# Patient Record
Sex: Female | Born: 1970 | Race: White | Hispanic: No | Smoking: Never smoker
Health system: Southern US, Community
[De-identification: ages and names within clinical notes are randomized; demographics above are authoritative.]

## PROBLEM LIST (undated history)

## (undated) DIAGNOSIS — I10 Essential (primary) hypertension: Secondary | ICD-10-CM

## (undated) DIAGNOSIS — E119 Type 2 diabetes mellitus without complications: Secondary | ICD-10-CM

## (undated) DIAGNOSIS — T7840XA Allergy, unspecified, initial encounter: Secondary | ICD-10-CM

## (undated) HISTORY — DX: Type 2 diabetes mellitus without complications: E11.9

## (undated) HISTORY — DX: Essential (primary) hypertension: I10

## (undated) HISTORY — DX: Allergy, unspecified, initial encounter: T78.40XA

## (undated) HISTORY — PX: ECTOPIC PREGNANCY SURGERY: SHX613

---

## 2006-03-05 ENCOUNTER — Ambulatory Visit: Payer: Self-pay | Admitting: Obstetrics and Gynecology

## 2006-03-20 ENCOUNTER — Ambulatory Visit: Payer: Self-pay | Admitting: Obstetrics and Gynecology

## 2006-04-25 ENCOUNTER — Ambulatory Visit: Payer: Self-pay | Admitting: Obstetrics and Gynecology

## 2007-05-12 ENCOUNTER — Ambulatory Visit: Payer: Self-pay | Admitting: Internal Medicine

## 2007-05-22 ENCOUNTER — Ambulatory Visit: Payer: Self-pay | Admitting: Emergency Medicine

## 2009-08-12 ENCOUNTER — Ambulatory Visit: Payer: Self-pay | Admitting: Internal Medicine

## 2010-03-09 ENCOUNTER — Ambulatory Visit: Payer: Self-pay | Admitting: Family Medicine

## 2010-03-10 ENCOUNTER — Ambulatory Visit: Payer: Self-pay | Admitting: Internal Medicine

## 2010-03-12 ENCOUNTER — Emergency Department: Payer: Self-pay | Admitting: Internal Medicine

## 2010-03-12 ENCOUNTER — Ambulatory Visit: Payer: Self-pay | Admitting: Internal Medicine

## 2010-03-18 ENCOUNTER — Ambulatory Visit: Payer: Self-pay | Admitting: Internal Medicine

## 2016-11-16 ENCOUNTER — Encounter: Payer: Self-pay | Admitting: *Deleted

## 2016-11-16 ENCOUNTER — Encounter: Payer: BLUE CROSS/BLUE SHIELD | Attending: Family Medicine | Admitting: *Deleted

## 2016-11-16 VITALS — BP 114/86 | Ht 71.0 in | Wt 213.8 lb

## 2016-11-16 DIAGNOSIS — Z713 Dietary counseling and surveillance: Secondary | ICD-10-CM | POA: Diagnosis present

## 2016-11-16 DIAGNOSIS — E119 Type 2 diabetes mellitus without complications: Secondary | ICD-10-CM | POA: Diagnosis not present

## 2016-11-16 NOTE — Progress Notes (Signed)
Diabetes Self-Management Education  Visit Type: First/Initial  Appt. Start Time: 1405 Appt. End Time: 1500  11/16/2016  [name], identified by name and date of birth, is a 46 y.o. female with a diagnosis of Diabetes: Type 2.   ASSESSMENT  Blood pressure 114/86, height 5\' 11"  (1.803 m), weight 213 lb 12.8 oz (97 kg). Body mass index is 29.82 kg/m.      Diabetes Self-Management Education - 11/16/16 1539      Visit Information   Visit Type First/Initial     Initial Visit   Diabetes Type Type 2   Are you currently following a meal plan? Yes   What type of meal plan do you follow? "low carbs and cutting out sugar intake"   Are you taking your medications as prescribed? Yes   Date Diagnosed this month     Health Coping   How would you rate your overall health? Good     Psychosocial Assessment   Patient Belief/Attitude about Diabetes Motivated to manage diabetes  "not being able to eat the foods I like to eat"   Self-care barriers None   Self-management support Doctor's office;Family   Patient Concerns Nutrition/Meal planning;Medication;Healthy Lifestyle;Monitoring;Problem Solving;Glycemic Control;Weight Control   Special Needs None   Preferred Learning Style Hands on   Learning Readiness Change in progress   How often do you need to have someone help you when you read instructions, pamphlets, or other written materials from your doctor or pharmacy? 1 - Never   What is the last grade level you completed in school? HS - taking online courses     Pre-Education Assessment   Patient understands the diabetes disease and treatment process. Needs Instruction   Patient understands incorporating nutritional management into lifestyle. Needs Instruction   Patient undertands incorporating physical activity into lifestyle. Needs Instruction   Patient understands using medications safely. Needs Instruction   Patient understands monitoring blood glucose, interpreting and using  results Needs Review   Patient understands prevention, detection, and treatment of acute complications. Needs Instruction   Patient understands prevention, detection, and treatment of chronic complications. Needs Instruction   Patient understands how to develop strategies to address psychosocial issues. Needs Instruction   Patient understands how to develop strategies to promote health/change behavior. Needs Instruction     Complications   Last HgB A1C per patient/outside source 10.7 %  10/25/16   How often do you check your blood sugar? 1-2 times/day   Fasting Blood glucose range (mg/dL) 16-109;604-54070-129;130-179  Pt reports FBG's range from 97-176 mg/dL.    Postprandial Blood glucose range (mg/dL) 981-191130-179  Pt reports pp's less than 160's mg/dL.    Have you had a dilated eye exam in the past 12 months? Yes   Have you had a dental exam in the past 12 months? No   Are you checking your feet? No     Dietary Intake   Breakfast cereal and milk; egg whites with [place] bacon or sausage and bread   Snack (morning) grapes   Lunch fried chicken tenders, fried okra, salads   Snack (afternoon) grapes   Dinner oven baked pork chops, beans, cauliflower; steak with baked potato; pizza; spaghetti   Snack (evening) sherbert   Beverage(s) water, unsweetened tea     Exercise   Exercise Type ADL's     Patient Education   Previous Diabetes Education No   Disease state  Definition of diabetes, type 1 and 2, and the diagnosis of diabetes   Nutrition management  Role of diet in the treatment of diabetes and the relationship between the three main macronutrients and blood glucose level;Reviewed blood glucose goals for pre and post meals and how to evaluate the patients' food intake on their blood glucose level.   Physical activity and exercise  Role of exercise on diabetes management, blood pressure control and cardiac health.   Medications Reviewed patients medication for diabetes, action, purpose, timing of dose  and side effects.   Monitoring Purpose and frequency of SMBG.;Taught/discussed recording of test results and interpretation of SMBG.;Identified appropriate SMBG and/or A1C goals.   Chronic complications Relationship between chronic complications and blood glucose control   Psychosocial adjustment Identified and addressed patients feelings and concerns about diabetes     Individualized Goals (developed by patient)   Reducing Risk Improve blood sugars Decrease medications Prevent diabetes complications Lose weight Lead a healthier lifestyle     Outcomes   Expected Outcomes Demonstrated interest in learning. Expect positive outcomes      Individualized Plan for Diabetes Self-Management Training:   Learning Objective:  Patient will have a greater understanding of diabetes self-management. Patient education plan is to attend individual and/or group sessions per assessed needs and concerns.   Plan:   Patient Instructions  Check blood sugars 2 x day before breakfast and 2 hrs after one meal every day Bring blood sugar records to the next class Exercise: Begin walking for 10-15  minutes 3 days a week and gradually increase to 30 minutes 5 x week Eat 3 meals day,   2  snacks a day Space meals 4-6 hours apart Allow 2-3 hours between meals and snacks Limit fried foods   Expected Outcomes:  Demonstrated interest in learning. Expect positive outcomes  Education material provided:  General Meal Planning Guidelines Simple Meal Plan  If problems or questions, patient to contact team via:  Sharion Settler, RN, CCM, CDE [phone]  Future DSME appointment:  December 07, 2016 for Diabetes Class 1

## 2016-11-16 NOTE — Patient Instructions (Signed)
Check blood sugars 2 x day before breakfast and 2 hrs after one meal every day  Bring blood sugar records to the next class  Exercise: Begin walking for 10-15  minutes 3 days a week and gradually increase to 30 minutes 5 x week  Eat 3 meals day,   2  snacks a day Space meals 4-6 hours apart Allow 2-3 hours between meals and snacks Limit fried foods  Return for classes on:

## 2016-12-07 ENCOUNTER — Encounter: Payer: BLUE CROSS/BLUE SHIELD | Attending: Family Medicine | Admitting: Dietician

## 2016-12-07 ENCOUNTER — Encounter: Payer: Self-pay | Admitting: Dietician

## 2016-12-07 VITALS — Ht 71.0 in | Wt 216.0 lb

## 2016-12-07 DIAGNOSIS — E119 Type 2 diabetes mellitus without complications: Secondary | ICD-10-CM | POA: Diagnosis not present

## 2016-12-07 DIAGNOSIS — Z713 Dietary counseling and surveillance: Secondary | ICD-10-CM | POA: Diagnosis not present

## 2016-12-07 NOTE — Progress Notes (Signed)

## 2017-01-11 ENCOUNTER — Encounter: Payer: Self-pay | Admitting: *Deleted

## 2017-01-11 ENCOUNTER — Encounter: Payer: BLUE CROSS/BLUE SHIELD | Attending: Family Medicine | Admitting: *Deleted

## 2017-01-11 VITALS — Wt 218.6 lb

## 2017-01-11 DIAGNOSIS — E119 Type 2 diabetes mellitus without complications: Secondary | ICD-10-CM | POA: Insufficient documentation

## 2017-01-11 DIAGNOSIS — Z713 Dietary counseling and surveillance: Secondary | ICD-10-CM | POA: Insufficient documentation

## 2017-01-11 NOTE — Progress Notes (Signed)
Appt. Start Time: 0900 Appt. End Time: 1130  Class 2 Nutritional Management - identify sources of carbohydrate, protein and fat; plan balanced meals; estimate servings of carbohydrates in meals  Psychosocial - identify DM as a source of stress; state the effects of stress on BG control  Exercise - describe the effects of exercise on blood glucose and importance of regular exercise in controlling diabetes; state a plan for personal exercise; verbalize contraindications for exercise  Self-Monitoring - state importance of SMBG; use SMBG results to effectively manage diabetes; identify importance of regular HbA1C testing and goals for results  Acute Complications - recognize hyperglycemia and hypoglycemia with causes and effects; identify blood glucose results as high, low or in control; list steps in treating and preventing high and low blood glucose  Sick Day Guidelines: state appropriate measure to manage blood glucose when ill (need for meds, HBGM plan, when to call physician, need for fluids)  Chronic Complications/Foot, Skin, Eye Dental Care - identify possible long-term complications of diabetes (retinopathy, neuropathy, nephropathy, cardiovascular disease, infections); explain steps in prevention and treatment of chronic complications; state importance of daily self-foot exams; describe how to examine feet and what to look for; explain appropriate eye and dental care  Lifestyle Changes/Goals - state benefits of making appropriate lifestyle changes; identify habits that need to change (meals, tobacco, alcohol); identify strategies to reduce risk factors for personal health  Pregnancy/Sexual Health - state importance of good blood glucose control in preventing sexual problems (impotence, vaginal dryness, infections, loss of desire)  Teaching Materials Used: Class 2 Slide Packet A1C Pamphlet Foot Care Literature Kidney Test Handout Quick and "Balanced" Meal Ideas Carb Counting and Meal  Planning Book Goals for Class 2 

## 2017-02-15 ENCOUNTER — Ambulatory Visit: Payer: BLUE CROSS/BLUE SHIELD

## 2017-03-15 ENCOUNTER — Ambulatory Visit: Payer: Self-pay

## 2017-04-13 ENCOUNTER — Encounter: Payer: Self-pay | Admitting: *Deleted

## 2017-06-07 ENCOUNTER — Emergency Department: Payer: Self-pay

## 2017-06-07 ENCOUNTER — Encounter: Payer: Self-pay | Admitting: Emergency Medicine

## 2017-06-07 ENCOUNTER — Emergency Department
Admission: EM | Admit: 2017-06-07 | Discharge: 2017-06-08 | Disposition: A | Discharge status: Home / Self Care | Payer: Self-pay | Attending: Emergency Medicine | Admitting: Emergency Medicine

## 2017-06-07 DIAGNOSIS — I10 Essential (primary) hypertension: Secondary | ICD-10-CM | POA: Insufficient documentation

## 2017-06-07 DIAGNOSIS — L03011 Cellulitis of right finger: Secondary | ICD-10-CM | POA: Insufficient documentation

## 2017-06-07 DIAGNOSIS — B999 Unspecified infectious disease: Secondary | ICD-10-CM

## 2017-06-07 DIAGNOSIS — Z7984 Long term (current) use of oral hypoglycemic drugs: Secondary | ICD-10-CM | POA: Insufficient documentation

## 2017-06-07 DIAGNOSIS — E119 Type 2 diabetes mellitus without complications: Secondary | ICD-10-CM | POA: Insufficient documentation

## 2017-06-07 DIAGNOSIS — L0390 Cellulitis, unspecified: Secondary | ICD-10-CM

## 2017-06-07 LAB — BASIC METABOLIC PANEL
Anion gap: 9 (ref 5–15)
BUN: 9 mg/dL (ref 6–20)
CHLORIDE: 99 mmol/L — AB (ref 101–111)
CO2: 26 mmol/L (ref 22–32)
CREATININE: 0.73 mg/dL (ref 0.44–1.00)
Calcium: 9.5 mg/dL (ref 8.9–10.3)
GFR calc Af Amer: >60 mL/min (ref >60)
GFR calc non Af Amer: >60 mL/min (ref >60)
Glucose, Bld: 128 mg/dL — ABNORMAL HIGH (ref 65–99)
Potassium: 3.9 mmol/L (ref 3.5–5.1)
SODIUM: 134 mmol/L — AB (ref 135–145)

## 2017-06-07 LAB — CBC
HCT: 42.6 % (ref 35.0–47.0)
HEMOGLOBIN: 14.5 g/dL (ref 12.0–16.0)
MCH: 29.3 pg (ref 26.0–34.0)
MCHC: 34 g/dL (ref 32.0–36.0)
MCV: 86 fL (ref 80.0–100.0)
Platelets: 393 10*3/uL (ref 150–440)
RBC: 4.95 MIL/uL (ref 3.80–5.20)
RDW: 13.4 % (ref 11.5–14.5)
WBC: 15.7 10*3/uL — AB (ref 3.6–11.0)

## 2017-06-07 LAB — LACTIC ACID, PLASMA: Lactic Acid, Venous: 1.4 mmol/L (ref 0.5–1.9)

## 2017-06-07 MED ORDER — VANCOMYCIN HCL IN DEXTROSE 1-5 GM/200ML-% IV SOLN
1000.0000 mg | Freq: Once | INTRAVENOUS | Status: AC
  Administered 2017-06-07: 1000 mg via INTRAVENOUS
  Filled 2017-06-07: qty 200

## 2017-06-07 MED ORDER — SULFAMETHOXAZOLE-TRIMETHOPRIM 800-160 MG PO TABS: 1.0000 | ORAL_TABLET | Freq: Two times a day (BID) | ORAL | 0 refills | Status: AC

## 2017-06-07 NOTE — ED Provider Notes (Signed)
[facility] Emergency Department Provider Note    ____________________________________________   I have reviewed the triage vital signs and the nursing notes.   HISTORY  Chief Complaint Finger infection  History limited by: Not Limited   HPI Amanda Shepard is a 46 y.o. female who presents to the emergency department today because of concern for finger infection.   LOCATION:right little finger DURATION:today TIMING: constant SEVERITY: severe QUALITY: pain CONTEXT: patient does not recall any direct trauma to her finger but states she was scraping ice off of her car windshield and thinks that might have been the cause.  MODIFYING FACTORS: worse with movement ASSOCIATED SYMPTOMS: denies any fevers or chills. No nausea or vomiting.  Per medical record review patient has a history of dm  Past Medical History:  Diagnosis Date  . Allergy   . Diabetes mellitus without complication (HCC)   . Hypertension     There are no active problems to display for this patient.   Past Surgical History:  Procedure Laterality Date  . ECTOPIC PREGNANCY SURGERY      Prior to Admission medications   Medication Sig Start Date End Date Taking? Authorizing Provider  atorvastatin (LIPITOR) 40 MG tablet Take 40 mg by mouth daily. 10/25/16 10/25/17  [provider]  glipiZIDE (GLUCOTROL XL) 10 MG 24 hr tablet Take 10 mg by mouth 2 (two) times daily. 10/25/16 10/25/17  [provider]  lisinopril-hydrochlorothiazide (PRINZIDE,ZESTORETIC) 20-25 MG tablet Take 1 tablet by mouth daily. 10/25/16 10/25/17  [provider]  montelukast (SINGULAIR) 10 MG tablet Take 10 mg by mouth daily. 10/25/16 10/25/17  [provider]    Allergies Patient has no known allergies.  Family History  Problem Relation Age of Onset  . Diabetes Father   . Diabetes Brother     Social History Social History   Tobacco Use  . Smoking status: Never Smoker  . Smokeless  tobacco: Never Used  Substance Use Topics  . Alcohol use: No  . Drug use: Not on file    Review of Systems Constitutional: No fever/chills Eyes: No visual changes. ENT: No sore throat. Cardiovascular: Denies chest pain. Respiratory: Denies shortness of breath. Gastrointestinal: No abdominal pain.  No nausea, no vomiting.  No diarrhea.   Genitourinary: Negative for dysuria. Musculoskeletal: Positive for right little finger pain and swelling. Skin: Negative for rash. Neurological: Negative for headaches, focal weakness or numbness.  ____________________________________________   PHYSICAL EXAM:  VITAL SIGNS: ED Triage Vitals  Enc Vitals Group     BP 06/07/17 1823 (!) 166/123     Pulse Rate 06/07/17 1823 (!) 116     Resp 06/07/17 1823 15     Temp 06/07/17 1823 99.7 F (37.6 C)     Temp Source 06/07/17 1823 Oral     SpO2 06/07/17 1823 98 %     Weight 06/07/17 1823 222 lb (100.7 kg)     Height 06/07/17 1823 5\' 11"  (1.803 m)     Head Circumference --      Peak Flow --      Pain Score 06/07/17 2123 5   Constitutional: Alert and oriented. Well appearing and in no distress. Eyes: Conjunctivae are normal.  ENT   Head: Normocephalic and atraumatic.   Nose: No congestion/rhinnorhea.   Mouth/Throat: Mucous membranes are moist.   Neck: No stridor. Hematological/Lymphatic/Immunilogical: No cervical lymphadenopathy. Cardiovascular: Normal rate, regular rhythm.  No murmurs, rubs, or gallops. Respiratory: Normal respiratory effort without tachypnea nor retractions. Breath sounds are clear  and equal bilaterally. No wheezes/rales/rhonchi. Gastrointestinal: Soft and non tender. No rebound. No guarding.  Genitourinary: Deferred Musculoskeletal: Right fifth digit with small area of erythema and swelling over the dorsal aspect of the middle phalanx. No palmar swelling. No pain elicited with passive extension of finger.  Neurologic:  Normal speech and language. No gross focal  neurologic deficits are appreciated.  Skin:  Slight linear erythema extending up forearm.  Psychiatric: Mood and affect are normal. Speech and behavior are normal. Patient exhibits appropriate insight and judgment.  ____________________________________________    LABS (pertinent positives/negatives)  Lactic 1.4 CBC wbc 15.7, hgb 14.5, plt 393 BMP na 134, glu 128  ____________________________________________   EKG  None  ____________________________________________    RADIOLOGY  Little right finger No bony abnormality ____________________________________________   PROCEDURES  Procedures  ____________________________________________   INITIAL IMPRESSION / ASSESSMENT AND PLAN / ED COURSE  Pertinent labs & imaging results that were available during my care of the patient were reviewed by me and considered in my medical decision making (see chart for details).  Patient presented because of concern for infection to right fifth digit. On exam there is small area of erythema and swelling to dorsal aspect. No flexor tendon swelling or pain with extension to suggest tenosynovitis. Some slight streaking up the arm consistent with lymphangitis. Mild leukocytosis on blood work but no elevated lactic acidosis. X-ray without sign of osteomyelitis. Patient given dose of IV antibiotics in the emergency department. Discussed findings and plan with patient. Discussed strict return precautions.  ____________________________________________   FINAL CLINICAL IMPRESSION(S) / ED DIAGNOSES  Final diagnoses:  Infection  Cellulitis, unspecified cellulitis site     Note: This dictation was prepared with Dragon dictation. Any transcriptional errors that result from this process are unintentional     Phineas SemenGoodman, Vi Biddinger, MD 06/08/17 [phone]

## 2017-06-07 NOTE — ED Notes (Signed)
First nurse note  Sent in by pcp with poss infection of right 5 th finger  States redness is moving up the arm this afternoon

## 2017-06-07 NOTE — ED Triage Notes (Signed)
Patient presents to ED via POV from PCP office for red streak starting from patients right pinky continuing up patients arm to her elbow. Per PCP who called in patient is non compliant with her medication. Patient is eating hardees during triage.

## 2017-06-07 NOTE — Discharge Instructions (Signed)
Please seek medical attention for any high fevers, chest pain, shortness of breath, change in behavior, persistent vomiting, bloody stool or any other new or concerning symptoms.  

## 2019-03-24 ENCOUNTER — Encounter: Payer: Self-pay | Admitting: Emergency Medicine

## 2019-03-24 ENCOUNTER — Other Ambulatory Visit: Payer: Self-pay

## 2019-03-24 DIAGNOSIS — E119 Type 2 diabetes mellitus without complications: Secondary | ICD-10-CM | POA: Diagnosis not present

## 2019-03-24 DIAGNOSIS — Z7984 Long term (current) use of oral hypoglycemic drugs: Secondary | ICD-10-CM | POA: Insufficient documentation

## 2019-03-24 DIAGNOSIS — J189 Pneumonia, unspecified organism: Secondary | ICD-10-CM | POA: Diagnosis not present

## 2019-03-24 DIAGNOSIS — I10 Essential (primary) hypertension: Secondary | ICD-10-CM | POA: Insufficient documentation

## 2019-03-24 DIAGNOSIS — U071 COVID-19: Secondary | ICD-10-CM | POA: Insufficient documentation

## 2019-03-24 DIAGNOSIS — Z79899 Other long term (current) drug therapy: Secondary | ICD-10-CM | POA: Diagnosis not present

## 2019-03-24 DIAGNOSIS — R509 Fever, unspecified: Secondary | ICD-10-CM | POA: Diagnosis not present

## 2019-03-24 DIAGNOSIS — R05 Cough: Secondary | ICD-10-CM | POA: Diagnosis present

## 2019-03-24 MED ORDER — ACETAMINOPHEN 325 MG PO TABS
650.0000 mg | ORAL_TABLET | Freq: Once | ORAL | Status: AC | PRN
  Administered 2019-03-24: 650 mg via ORAL
  Filled 2019-03-24: qty 2

## 2019-03-24 NOTE — ED Triage Notes (Signed)
Pt presents to ED with non-productive cough and fever for the past 2 weeks. Pt currently has no increased work of breathing or acute distress noted.

## 2019-03-25 ENCOUNTER — Emergency Department
Admission: EM | Admit: 2019-03-25 | Discharge: 2019-03-25 | Disposition: A | Discharge status: Home / Self Care | Payer: BC Managed Care – PPO | Attending: Emergency Medicine | Admitting: Emergency Medicine

## 2019-03-25 ENCOUNTER — Emergency Department: Payer: BC Managed Care – PPO

## 2019-03-25 DIAGNOSIS — U071 COVID-19: Secondary | ICD-10-CM

## 2019-03-25 DIAGNOSIS — J189 Pneumonia, unspecified organism: Secondary | ICD-10-CM

## 2019-03-25 DIAGNOSIS — R509 Fever, unspecified: Secondary | ICD-10-CM

## 2019-03-25 LAB — URINALYSIS, ROUTINE W REFLEX MICROSCOPIC
Bacteria, UA: NONE SEEN
Bilirubin Urine: NEGATIVE
Glucose, UA: NEGATIVE mg/dL
Hgb urine dipstick: NEGATIVE
Ketones, ur: NEGATIVE mg/dL
Nitrite: NEGATIVE
Protein, ur: 30 mg/dL — AB
Specific Gravity, Urine: 1.017 (ref 1.005–1.030)
pH: 6 (ref 5.0–8.0)

## 2019-03-25 LAB — CBC WITH DIFFERENTIAL/PLATELET
Abs Immature Granulocytes: 0.05 10*3/uL (ref 0.00–0.07)
Basophils Absolute: 0 10*3/uL (ref 0.0–0.1)
Basophils Relative: 0 %
Eosinophils Absolute: 0 10*3/uL (ref 0.0–0.5)
Eosinophils Relative: 0 %
HCT: 42.1 % (ref 36.0–46.0)
Hemoglobin: 14.3 g/dL (ref 12.0–15.0)
Immature Granulocytes: 1 %
Lymphocytes Relative: 17 %
Lymphs Abs: 1.6 10*3/uL (ref 0.7–4.0)
MCH: 29.1 pg (ref 26.0–34.0)
MCHC: 34 g/dL (ref 30.0–36.0)
MCV: 85.6 fL (ref 80.0–100.0)
Monocytes Absolute: 0.3 10*3/uL (ref 0.1–1.0)
Monocytes Relative: 3 %
Neutro Abs: 7.8 10*3/uL — ABNORMAL HIGH (ref 1.7–7.7)
Neutrophils Relative %: 79 %
Platelets: 266 10*3/uL (ref 150–400)
RBC: 4.92 MIL/uL (ref 3.87–5.11)
RDW: 12.9 % (ref 11.5–15.5)
WBC: 9.7 10*3/uL (ref 4.0–10.5)
nRBC: 0 % (ref 0.0–0.2)

## 2019-03-25 LAB — COMPREHENSIVE METABOLIC PANEL
ALT: 80 U/L — ABNORMAL HIGH (ref 0–44)
AST: 69 U/L — ABNORMAL HIGH (ref 15–41)
Albumin: 3.8 g/dL (ref 3.5–5.0)
Alkaline Phosphatase: 128 U/L — ABNORMAL HIGH (ref 38–126)
Anion gap: 11 (ref 5–15)
BUN: 9 mg/dL (ref 6–20)
CO2: 22 mmol/L (ref 22–32)
Calcium: 8.9 mg/dL (ref 8.9–10.3)
Chloride: 99 mmol/L (ref 98–111)
Creatinine, Ser: 0.62 mg/dL (ref 0.44–1.00)
GFR calc Af Amer: >60 mL/min (ref >60)
GFR calc non Af Amer: >60 mL/min (ref >60)
Glucose, Bld: 277 mg/dL — ABNORMAL HIGH (ref 70–99)
Potassium: 3.6 mmol/L (ref 3.5–5.1)
Sodium: 132 mmol/L — ABNORMAL LOW (ref 135–145)
Total Bilirubin: 1.1 mg/dL (ref 0.3–1.2)
Total Protein: 8 g/dL (ref 6.5–8.1)

## 2019-03-25 LAB — SARS CORONAVIRUS 2 BY RT PCR (HOSPITAL ORDER, PERFORMED IN ~~LOC~~ HOSPITAL LAB): SARS Coronavirus 2: POSITIVE — AB

## 2019-03-25 LAB — LACTIC ACID, PLASMA: Lactic Acid, Venous: 1.2 mmol/L (ref 0.5–1.9)

## 2019-03-25 MED ORDER — AZITHROMYCIN 250 MG PO TABS: 250.0000 mg | ORAL_TABLET | Freq: Every day | ORAL | 0 refills | Status: DC

## 2019-03-25 MED ORDER — HYDROCOD POLST-CPM POLST ER 10-8 MG/5ML PO SUER: 5.0000 mL | Freq: Two times a day (BID) | ORAL | 0 refills | Status: AC

## 2019-03-25 MED ORDER — SODIUM CHLORIDE 0.9 % IV SOLN
500.0000 mg | INTRAVENOUS | Status: DC
  Administered 2019-03-25: 500 mg via INTRAVENOUS
  Filled 2019-03-25: qty 500

## 2019-03-25 MED ORDER — CEPHALEXIN 500 MG PO CAPS: 500.0000 mg | ORAL_CAPSULE | Freq: Three times a day (TID) | ORAL | 0 refills | Status: DC

## 2019-03-25 MED ORDER — SODIUM CHLORIDE 0.9 % IV SOLN
2.0000 g | INTRAVENOUS | Status: DC
  Administered 2019-03-25: 2 g via INTRAVENOUS
  Filled 2019-03-25: qty 20

## 2019-03-25 MED ORDER — SODIUM CHLORIDE 0.9 % IV BOLUS (SEPSIS)
1000.0000 mL | Freq: Once | INTRAVENOUS | Status: DC
  Administered 2019-03-25: 1000 mL via INTRAVENOUS

## 2019-03-25 MED ORDER — ALBUTEROL SULFATE HFA 108 (90 BASE) MCG/ACT IN AERS: 2.0000 | INHALATION_SPRAY | RESPIRATORY_TRACT | 0 refills | Status: AC | PRN

## 2019-03-25 MED ORDER — SODIUM CHLORIDE 0.9 % IV BOLUS (SEPSIS)
1000.0000 mL | Freq: Once | INTRAVENOUS | Status: AC
  Administered 2019-03-25: 1000 mL via INTRAVENOUS

## 2019-03-25 MED ORDER — PREDNISONE 20 MG PO TABS: ORAL_TABLET | ORAL | 0 refills | Status: DC

## 2019-03-25 NOTE — Progress Notes (Signed)
CODE SEPSIS - PHARMACY COMMUNICATION  **Broad Spectrum Antibiotics should be administered within 1 hour of Sepsis diagnosis**  Time Code Sepsis Called/Page Received: 1248  Antibiotics Ordered: Rocephin and Zithromax  Time of 1st antibiotic administration: 0153  Additional action taken by pharmacy: n/a  If necessary, Name of Provider/Nurse Contacted: n/a   Ena Dawley ,PharmD Clinical Pharmacist  03/25/2019  2:12 AM

## 2019-03-25 NOTE — ED Notes (Signed)
Patient ambulated around room while having O2 monitored. Patient's O2 maintained above 95% on room air during ambulation.

## 2019-03-25 NOTE — ED Provider Notes (Signed)
[facility] Emergency Department Provider Note   ____________________________________________   First MD Initiated Contact with Patient 03/25/19 0037     (approximate)  I have reviewed the triage vital signs and the nursing notes.   HISTORY  Chief Complaint Cough and Fever    HPI Amanda Shepard is a 48 y.o. female who presents to the ED from home with a chief complaint of fever and cough.  Patient started having symptoms 2 weeks ago.  Stepdaughter was a COVID-19 PUI but ultimately tested negative.  Patient reports sinus congestion and pressure, sore throat, nonproductive cough and diarrhea.  Denies chest pain, shortness of breath, abdominal pain, nausea, vomiting or dysuria.  Denies recent travel or trauma.       Past Medical History:  Diagnosis Date  . Allergy   . Diabetes mellitus without complication (HCC)   . Hypertension     There are no active problems to display for this patient.   Past Surgical History:  Procedure Laterality Date  . ECTOPIC PREGNANCY SURGERY      Prior to Admission medications   Medication Sig Start Date End Date Taking? Authorizing Provider  albuterol (VENTOLIN HFA) 108 (90 Base) MCG/ACT inhaler Inhale 2 puffs into the lungs every 4 (four) hours as needed for wheezing or shortness of breath. 03/25/19   Irean Hong, MD  atorvastatin (LIPITOR) 40 MG tablet Take 40 mg by mouth daily. 10/25/16 10/25/17  [provider]  azithromycin (ZITHROMAX) 250 MG tablet Take 1 tablet (250 mg total) by mouth daily. 03/25/19   Irean Hong, MD  cephALEXin (KEFLEX) 500 MG capsule Take 1 capsule (500 mg total) by mouth 3 (three) times daily. 03/25/19   Irean Hong, MD  chlorpheniramine-HYDROcodone (TUSSIONEX PENNKINETIC ER) 10-8 MG/5ML SUER Take 5 mLs by mouth 2 (two) times daily. 03/25/19   Irean Hong, MD  glipiZIDE (GLUCOTROL XL) 10 MG 24 hr tablet Take 10 mg by mouth 2 (two) times daily. 10/25/16 10/25/17  [provider]   lisinopril-hydrochlorothiazide (PRINZIDE,ZESTORETIC) 20-25 MG tablet Take 1 tablet by mouth daily. 10/25/16 10/25/17  [provider]  montelukast (SINGULAIR) 10 MG tablet Take 10 mg by mouth daily. 10/25/16 10/25/17  [provider]  predniSONE (DELTASONE) 20 MG tablet 3 tablets x 5 days 03/25/19   Irean Hong, MD    Allergies Patient has no known allergies.  Family History  Problem Relation Age of Onset  . Diabetes Father   . Diabetes Brother     Social History Social History   Tobacco Use  . Smoking status: Never Smoker  . Smokeless tobacco: Never Used  Substance Use Topics  . Alcohol use: No  . Drug use: Never    Review of Systems  Constitutional: Positive for fever Eyes: No visual changes. ENT: Positive for sore throat. Cardiovascular: Denies chest pain. Respiratory: Positive for cough.  Denies shortness of breath. Gastrointestinal: No abdominal pain.  No nausea, no vomiting.  Positive for diarrhea.  No constipation. Genitourinary: Negative for dysuria. Musculoskeletal: Negative for back pain. Skin: Negative for rash. Neurological: Negative for headaches, focal weakness or numbness.   ____________________________________________   PHYSICAL EXAM:  VITAL SIGNS: ED Triage Vitals  Enc Vitals Group     BP 03/24/19 2212 128/77     Pulse Rate 03/24/19 2212 (!) 128     Resp 03/24/19 2212 20     Temp 03/24/19 2212 (!) 101.7 F (38.7 C)     Temp Source 03/24/19 2212 Oral  SpO2 03/24/19 2212 94 %     Weight 03/24/19 2212 211 lb (95.7 kg)     Height 03/24/19 2212 5\' 11"  (1.803 m)     Head Circumference --      Peak Flow --      Pain Score 03/24/19 2215 0     Pain Loc --      Pain Edu? --      Excl. in GC? --     Constitutional: Alert and oriented.  Tired appearing and in mild acute distress. Eyes: Conjunctivae are normal. PERRL. EOMI. Head: Atraumatic. Nose: No congestion/rhinnorhea. Mouth/Throat: Mucous membranes are moist.  Oropharynx  non-erythematous. Neck: No stridor.  Supple neck without meningismus. Cardiovascular: Normal rate, regular rhythm. Grossly normal heart sounds.  Good peripheral circulation. Respiratory: Normal respiratory effort.  No retractions. Lungs with scattered rhonchi. Gastrointestinal: Soft and nontender. No distention. No abdominal bruits. No CVA tenderness. Musculoskeletal: No lower extremity tenderness nor edema.  No joint effusions. Neurologic:  Normal speech and language. No gross focal neurologic deficits are appreciated. No gait instability. Skin:  Skin is warm, dry and intact. No rash noted.  No petechiae. Psychiatric: Mood and affect are normal. Speech and behavior are normal.  ____________________________________________   LABS (all labs ordered are listed, but only abnormal results are displayed)  Labs Reviewed  SARS CORONAVIRUS 2 (HOSPITAL ORDER, PERFORMED IN [place] [facility] [facility]) - Abnormal; Notable for the following components:      Result Value   SARS Coronavirus 2 POSITIVE (*)    All other components within normal limits  COMPREHENSIVE METABOLIC PANEL - Abnormal; Notable for the following components:   Sodium 132 (*)    Glucose, Bld 277 (*)    AST 69 (*)    ALT 80 (*)    Alkaline Phosphatase 128 (*)    All other components within normal limits  CBC WITH DIFFERENTIAL/PLATELET - Abnormal; Notable for the following components:   Neutro Abs 7.8 (*)    All other components within normal limits  URINALYSIS, ROUTINE W REFLEX MICROSCOPIC - Abnormal; Notable for the following components:   Color, Urine YELLOW (*)    APPearance HAZY (*)    Protein, ur 30 (*)    Leukocytes,Ua MODERATE (*)    All other components within normal limits  CULTURE, BLOOD (ROUTINE X 2)  CULTURE, BLOOD (ROUTINE X 2)  URINE CULTURE  LACTIC ACID, PLASMA   ____________________________________________  EKG  ED ECG REPORT I, Antoin Dargis J, the attending physician, personally viewed and interpreted  this ECG.   Date: 03/25/2019  EKG Time: 0210  Rate: 91  Rhythm: normal EKG, normal sinus rhythm  Axis: Normal  Intervals:none  ST&T Change: Nonspecific  ____________________________________________  RADIOLOGY  ED MD interpretation: Multifocal pneumonia  Official radiology report(s): Dg Chest Port 1 View  Result Date: 03/25/2019 CLINICAL DATA:  Fever, cough for 2 weeks EXAM: PORTABLE CHEST 1 VIEW COMPARISON:  None. FINDINGS: Patchy areas of consolidation are present predominantly in the lung periphery and bases. No pneumothorax or visible effusion. The cardiomediastinal contours are unremarkable. Mild central airways thickening. No acute osseous or soft tissue abnormality. IMPRESSION: 1. Airways thickening and patchy areas of consolidation in the lung periphery and bases, consistent with multifocal pneumonia. No pleural effusion or pneumothorax. Electronically Signed   By: Kreg ShropshirePrice  DeHay M.D.   On: 03/25/2019 00:58    ____________________________________________   PROCEDURES  Procedure(s) performed (including Critical Care):  Procedures   ____________________________________________   INITIAL IMPRESSION / ASSESSMENT AND PLAN /  ED COURSE  As part of my medical decision making, I reviewed the following data within the [place] notes reviewed and incorporated, Labs reviewed, EKG interpreted, Old chart reviewed, Radiograph reviewed and Notes from prior ED visits     Amanda Shepard was evaluated in Emergency Department on 03/25/2019 for the symptoms described in the history of present illness. She was evaluated in the context of the global COVID-19 pandemic, which necessitated consideration that the patient might be at risk for infection with the SARS-CoV-2 virus that causes COVID-19. Institutional protocols and algorithms that pertain to the evaluation of patients at risk for COVID-19 are in a state of rapid change based on information released by regulatory  bodies including the CDC and federal and state organizations. These policies and algorithms were followed during the patient's care in the ED.    48 year old female who presents with fever, cough, sore throat, sinus congestion.  Differential diagnosis includes but is not limited to viral process, specifically COVID-19, CAP, URI, bronchitis, etc.  Patient meets sepsis criteria based on temperature and pulse rate.  Will obtain sepsis lab work, chest x-ray, cope with swab.  Initiate IV fluid resuscitation, ambulate patient with pulse oximeter on room air and reassess.   Clinical Course as of Mar 24 454  Tue Mar 25, 2019  0126 Recheck oral temperature 99 F.  Chest x-ray consistent with multifocal pneumonia; will initiate IV antibiotics.   [JS]  [id number] Patient ambulated with pulse ox and maintained oxygen saturations above 95%.  She is currently resting in no acute distress.  Given no hypoxia, dyspnea, tachypnea or elevated lactic acid, do not feel transfer to [facility] is warranted.  Will discharge home on antibiotics, albuterol inhaler, steroids.  Strict return precautions given.  Patient verbalizes understanding and agrees with plan of care.   [JS]  0357 Moderate leukocytes and 6-10 WBC noted in urine; patient asymptomatic.  Already going home on Keflex.  Will await urine culture.   [JS]    Clinical Course User Index [JS] Paulette Blanch, MD     ____________________________________________   FINAL CLINICAL IMPRESSION(S) / ED DIAGNOSES  Final diagnoses:  Fever, unspecified fever cause  Community acquired pneumonia, unspecified laterality  COVID-19     ED Discharge Orders         Ordered    cephALEXin (KEFLEX) 500 MG capsule  3 times daily     03/25/19 0343    azithromycin (ZITHROMAX) 250 MG tablet  Daily     03/25/19 0343    predniSONE (DELTASONE) 20 MG tablet     03/25/19 0343    chlorpheniramine-HYDROcodone (TUSSIONEX PENNKINETIC ER) 10-8 MG/5ML SUER  2 times daily      03/25/19 0343    albuterol (VENTOLIN HFA) 108 (90 Base) MCG/ACT inhaler  Every 4 hours PRN     03/25/19 0343           Note:  This document was prepared using Dragon voice recognition software and may include unintentional dictation errors.   Paulette Blanch, MD 03/25/19 [phone]

## 2019-03-25 NOTE — Discharge Instructions (Signed)
1.  Take antibiotics as prescribed: Keflex 500 mg 3 times daily x7 days Azithromycin 250 mg daily x4 days 2.  Take steroid as prescribed (prednisone 60 mg daily x5 days). 3.  You may take cough medicine as needed (Tussionex). 4.  Use Albuterol inhaler 2 puffs every 4 hours as needed for cough/difficulty breathing. 5.  Return to the ER for worsening symptoms, persistent vomiting, difficulty breathing or other concerns.

## 2019-03-26 LAB — URINE CULTURE

## 2019-03-30 LAB — CULTURE, BLOOD (ROUTINE X 2)
Culture: NO GROWTH
Culture: NO GROWTH
Special Requests: ADEQUATE

## 2019-03-31 ENCOUNTER — Encounter (HOSPITAL_COMMUNITY): Payer: Self-pay

## 2019-03-31 ENCOUNTER — Emergency Department
Admission: EM | Admit: 2019-03-31 | Discharge: 2019-03-31 | Disposition: A | Discharge status: Other Acute Inpatient Hospital | Payer: BC Managed Care – PPO | Attending: Student in an Organized Health Care Education/Training Program | Admitting: Student in an Organized Health Care Education/Training Program

## 2019-03-31 ENCOUNTER — Other Ambulatory Visit: Payer: Self-pay

## 2019-03-31 ENCOUNTER — Encounter: Payer: Self-pay | Admitting: Emergency Medicine

## 2019-03-31 ENCOUNTER — Inpatient Hospital Stay (HOSPITAL_COMMUNITY)
Admission: AD | Admit: 2019-03-31 | Discharge: 2019-04-03 | DRG: 637 | Disposition: A | Discharge status: Home / Self Care | Payer: BC Managed Care – PPO | Source: Other Acute Inpatient Hospital | Attending: Internal Medicine | Admitting: Internal Medicine

## 2019-03-31 DIAGNOSIS — R739 Hyperglycemia, unspecified: Secondary | ICD-10-CM

## 2019-03-31 DIAGNOSIS — Z833 Family history of diabetes mellitus: Secondary | ICD-10-CM | POA: Diagnosis not present

## 2019-03-31 DIAGNOSIS — E1165 Type 2 diabetes mellitus with hyperglycemia: Secondary | ICD-10-CM | POA: Diagnosis not present

## 2019-03-31 DIAGNOSIS — E1010 Type 1 diabetes mellitus with ketoacidosis without coma: Secondary | ICD-10-CM | POA: Diagnosis not present

## 2019-03-31 DIAGNOSIS — Z79899 Other long term (current) drug therapy: Secondary | ICD-10-CM | POA: Diagnosis not present

## 2019-03-31 DIAGNOSIS — T380X5A Adverse effect of glucocorticoids and synthetic analogues, initial encounter: Secondary | ICD-10-CM | POA: Diagnosis not present

## 2019-03-31 DIAGNOSIS — Z7984 Long term (current) use of oral hypoglycemic drugs: Secondary | ICD-10-CM | POA: Diagnosis not present

## 2019-03-31 DIAGNOSIS — I10 Essential (primary) hypertension: Secondary | ICD-10-CM | POA: Diagnosis not present

## 2019-03-31 DIAGNOSIS — N179 Acute kidney failure, unspecified: Secondary | ICD-10-CM | POA: Diagnosis not present

## 2019-03-31 DIAGNOSIS — U071 COVID-19: Secondary | ICD-10-CM

## 2019-03-31 DIAGNOSIS — E1110 Type 2 diabetes mellitus with ketoacidosis without coma: Principal | ICD-10-CM | POA: Diagnosis present

## 2019-03-31 DIAGNOSIS — E860 Dehydration: Secondary | ICD-10-CM | POA: Diagnosis present

## 2019-03-31 DIAGNOSIS — Z9114 Patient's other noncompliance with medication regimen: Secondary | ICD-10-CM

## 2019-03-31 DIAGNOSIS — J45909 Unspecified asthma, uncomplicated: Secondary | ICD-10-CM | POA: Diagnosis present

## 2019-03-31 DIAGNOSIS — Z7952 Long term (current) use of systemic steroids: Secondary | ICD-10-CM | POA: Insufficient documentation

## 2019-03-31 LAB — COMPREHENSIVE METABOLIC PANEL
ALT: 177 U/L — ABNORMAL HIGH (ref 0–44)
AST: 41 U/L (ref 15–41)
Albumin: 3.8 g/dL (ref 3.5–5.0)
Alkaline Phosphatase: 169 U/L — ABNORMAL HIGH (ref 38–126)
Anion gap: 18 — ABNORMAL HIGH (ref 5–15)
BUN: 25 mg/dL — ABNORMAL HIGH (ref 6–20)
CO2: 19 mmol/L — ABNORMAL LOW (ref 22–32)
Calcium: 9.5 mg/dL (ref 8.9–10.3)
Chloride: 85 mmol/L — ABNORMAL LOW (ref 98–111)
Creatinine, Ser: 0.93 mg/dL (ref 0.44–1.00)
GFR calc Af Amer: >60 mL/min (ref >60)
GFR calc non Af Amer: >60 mL/min (ref >60)
Glucose, Bld: 675 mg/dL (ref 70–99)
Potassium: 3.9 mmol/L (ref 3.5–5.1)
Sodium: 122 mmol/L — ABNORMAL LOW (ref 135–145)
Total Bilirubin: 1.1 mg/dL (ref 0.3–1.2)
Total Protein: 7.7 g/dL (ref 6.5–8.1)

## 2019-03-31 LAB — CBC WITH DIFFERENTIAL/PLATELET
Abs Immature Granulocytes: 0.91 10*3/uL — ABNORMAL HIGH (ref 0.00–0.07)
Basophils Absolute: 0.1 10*3/uL (ref 0.0–0.1)
Basophils Relative: 0 %
Eosinophils Absolute: 0.1 10*3/uL (ref 0.0–0.5)
Eosinophils Relative: 0 %
HCT: 43.5 % (ref 36.0–46.0)
Hemoglobin: 15.6 g/dL — ABNORMAL HIGH (ref 12.0–15.0)
Immature Granulocytes: 3 %
Lymphocytes Relative: 5 %
Lymphs Abs: 1.4 10*3/uL (ref 0.7–4.0)
MCH: 29.5 pg (ref 26.0–34.0)
MCHC: 35.9 g/dL (ref 30.0–36.0)
MCV: 82.2 fL (ref 80.0–100.0)
Monocytes Absolute: 1.2 10*3/uL — ABNORMAL HIGH (ref 0.1–1.0)
Monocytes Relative: 4 %
Neutro Abs: 24.1 10*3/uL — ABNORMAL HIGH (ref 1.7–7.7)
Neutrophils Relative %: 88 %
Platelets: 569 10*3/uL — ABNORMAL HIGH (ref 150–400)
RBC: 5.29 MIL/uL — ABNORMAL HIGH (ref 3.87–5.11)
RDW: 12.1 % (ref 11.5–15.5)
Smear Review: NORMAL
WBC: 27.7 10*3/uL — ABNORMAL HIGH (ref 4.0–10.5)
nRBC: 0 % (ref 0.0–0.2)

## 2019-03-31 LAB — URINALYSIS, COMPLETE (UACMP) WITH MICROSCOPIC
Bacteria, UA: NONE SEEN
Bilirubin Urine: NEGATIVE
Glucose, UA: >=500 mg/dL — AB
Hgb urine dipstick: NEGATIVE
Ketones, ur: NEGATIVE mg/dL
Leukocytes,Ua: NEGATIVE
Nitrite: NEGATIVE
Protein, ur: NEGATIVE mg/dL
Specific Gravity, Urine: 1.026 (ref 1.005–1.030)
pH: 6 (ref 5.0–8.0)

## 2019-03-31 LAB — C-REACTIVE PROTEIN: CRP: 3.2 mg/dL — ABNORMAL HIGH (ref <1.0)

## 2019-03-31 LAB — GLUCOSE, CAPILLARY
Glucose-Capillary: >600 mg/dL (ref 70–99)
Glucose-Capillary: >600 mg/dL (ref 70–99)
Glucose-Capillary: >600 mg/dL (ref 70–99)
Glucose-Capillary: 493 mg/dL — ABNORMAL HIGH (ref 70–99)
Glucose-Capillary: 441 mg/dL — ABNORMAL HIGH (ref 70–99)
Glucose-Capillary: 396 mg/dL — ABNORMAL HIGH (ref 70–99)
Glucose-Capillary: 314 mg/dL — ABNORMAL HIGH (ref 70–99)
Glucose-Capillary: 286 mg/dL — ABNORMAL HIGH (ref 70–99)
Glucose-Capillary: 259 mg/dL — ABNORMAL HIGH (ref 70–99)

## 2019-03-31 LAB — SEDIMENTATION RATE: Sed Rate: 13 mm/hr (ref 0–20)

## 2019-03-31 LAB — PREGNANCY, URINE: Preg Test, Ur: NEGATIVE

## 2019-03-31 MED ORDER — SODIUM CHLORIDE 0.9 % IV BOLUS
500.0000 mL | Freq: Once | INTRAVENOUS | Status: AC
  Administered 2019-03-31: 500 mL via INTRAVENOUS

## 2019-03-31 MED ORDER — INSULIN ASPART 100 UNIT/ML ~~LOC~~ SOLN
10.0000 [IU] | Freq: Once | SUBCUTANEOUS | Status: AC
  Administered 2019-03-31: 13:00:00 10 [IU] via INTRAVENOUS
  Filled 2019-03-31: qty 1

## 2019-03-31 MED ORDER — ENOXAPARIN SODIUM 40 MG/0.4ML ~~LOC~~ SOLN
40.0000 mg | SUBCUTANEOUS | Status: DC
  Administered 2019-04-01 – 2019-04-03 (×3): 40 mg via SUBCUTANEOUS
  Filled 2019-03-31 (×3): qty 0.4

## 2019-03-31 MED ORDER — INSULIN GLARGINE 100 UNIT/ML ~~LOC~~ SOLN
25.0000 [IU] | Freq: Once | SUBCUTANEOUS | Status: AC
  Administered 2019-03-31: 25 [IU] via SUBCUTANEOUS
  Filled 2019-03-31: qty 0.25

## 2019-03-31 MED ORDER — POTASSIUM CHLORIDE 10 MEQ/100ML IV SOLN
10.0000 meq | INTRAVENOUS | Status: AC
  Administered 2019-03-31 – 2019-04-01 (×4): 10 meq via INTRAVENOUS
  Filled 2019-03-31 (×4): qty 100

## 2019-03-31 MED ORDER — ENOXAPARIN SODIUM 40 MG/0.4ML ~~LOC~~ SOLN
40.0000 mg | Freq: Once | SUBCUTANEOUS | Status: AC
  Administered 2019-03-31: 40 mg via SUBCUTANEOUS
  Filled 2019-03-31: qty 0.4

## 2019-03-31 MED ORDER — SODIUM CHLORIDE 0.9 % IV BOLUS
1000.0000 mL | Freq: Once | INTRAVENOUS | Status: AC
  Administered 2019-03-31: 1000 mL via INTRAVENOUS

## 2019-03-31 MED ORDER — INSULIN REGULAR(HUMAN) IN NACL 100-0.9 UT/100ML-% IV SOLN
INTRAVENOUS | Status: DC
  Administered 2019-03-31: 4.3 [IU]/h via INTRAVENOUS

## 2019-03-31 MED ORDER — DEXTROSE-NACL 5-0.45 % IV SOLN: INTRAVENOUS | Status: DC

## 2019-03-31 MED ORDER — SODIUM CHLORIDE 0.9 % IV SOLN
Freq: Once | INTRAVENOUS | Status: AC
  Administered 2019-03-31: 16:00:00 via INTRAVENOUS

## 2019-03-31 MED ORDER — ONDANSETRON HCL 4 MG/2ML IJ SOLN
4.0000 mg | Freq: Once | INTRAMUSCULAR | Status: AC
  Administered 2019-03-31: 4 mg via INTRAVENOUS
  Filled 2019-03-31: qty 2

## 2019-03-31 MED ORDER — DEXTROSE-NACL 5-0.45 % IV SOLN
INTRAVENOUS | Status: DC
  Administered 2019-04-01: 01:00:00 via INTRAVENOUS

## 2019-03-31 MED ORDER — LACTATED RINGERS IV SOLN
INTRAVENOUS | Status: DC
  Administered 2019-03-31: 17:00:00 via INTRAVENOUS

## 2019-03-31 MED ORDER — PNEUMOCOCCAL VAC POLYVALENT 25 MCG/0.5ML IJ INJ: 0.5000 mL | INJECTION | INTRAMUSCULAR | Status: DC

## 2019-03-31 MED ORDER — SODIUM CHLORIDE 0.9 % IV SOLN
INTRAVENOUS | Status: DC
  Administered 2019-03-31: via INTRAVENOUS

## 2019-03-31 MED ORDER — INSULIN REGULAR(HUMAN) IN NACL 100-0.9 UT/100ML-% IV SOLN
INTRAVENOUS | Status: DC
  Administered 2019-03-31: 10 [IU]/h via INTRAVENOUS

## 2019-03-31 NOTE — ED Notes (Signed)
Pt given water to drink at this time. This RN will continue to monitor.

## 2019-03-31 NOTE — H&P (Signed)
History and Physical    Amanda Shepard ZOX:[id number] DOB: 17-Mar-1971 DOA: 03/31/2019  PCP: Titus MouldWhite, Elizabeth Burney, NP  Patient coming from: home  Chief Complaint:  High sugar  HPI: Amanda Shepard is a 48 y.o. female with medical history significant of dm not on insulin at home, htn recently diagnosed and treated for covid 19 with oral prednisone 60mg  daily finished today comes to [place] ed with high sugars.  As for her respiratory illness she is much better, no sob.  Found to be in DKA and placed on insulin drip and gap of 18 mildly elevated.  Referred for admission for dka caused by steroids from recent covid infection.     Review of Systems: As per HPI otherwise 10 point review of systems negative.   Past Medical History:  Diagnosis Date  . Allergy   . Diabetes mellitus without complication (HCC)   . Hypertension     Past Surgical History:  Procedure Laterality Date  . ECTOPIC PREGNANCY SURGERY       reports that she has never smoked. She has never used smokeless tobacco. She reports that she does not drink alcohol or use drugs.  No Known Allergies  Family History  Problem Relation Age of Onset  . Diabetes Father   . Diabetes Brother     Prior to Admission medications   Medication Sig Start Date End Date Taking? Authorizing Provider  albuterol (VENTOLIN HFA) 108 (90 Base) MCG/ACT inhaler Inhale 2 puffs into the lungs every 4 (four) hours as needed for wheezing or shortness of breath. 03/25/19  Yes Irean HongSung, Jade J, MD  chlorpheniramine-HYDROcodone (TUSSIONEX PENNKINETIC ER) 10-8 MG/5ML SUER Take 5 mLs by mouth 2 (two) times daily. 03/25/19  Yes Irean HongSung, Jade J, MD  glipiZIDE (GLUCOTROL XL) 10 MG 24 hr tablet Take 10 mg by mouth 2 (two) times daily. 10/25/16 03/31/19 Yes [provider]  metFORMIN (GLUCOPHAGE) 500 MG tablet Take 500 mg by mouth 2 (two) times daily. 01/26/19  Yes [provider]  montelukast (SINGULAIR) 10 MG tablet Take 10 mg by mouth daily. 10/25/16  03/31/19 Yes [provider]  atorvastatin (LIPITOR) 40 MG tablet Take 40 mg by mouth daily. 10/25/16 03/31/19  [provider]  lisinopril-hydrochlorothiazide (PRINZIDE,ZESTORETIC) 20-25 MG tablet Take 1 tablet by mouth daily. 10/25/16 03/31/19  [provider]    Physical Exam: There were no vitals filed for this visit.  afvss  Constitutional: NAD, calm, comfortable There were no vitals filed for this visit. Eyes: PERRL, lids and conjunctivae normal ENMT: Mucous membranes are moist. Posterior pharynx clear of any exudate or lesions.Normal dentition.  Neck: normal, supple, no masses, no thyromegaly Respiratory: clear to auscultation bilaterally, no wheezing, no crackles. Normal respiratory effort. No accessory muscle use.  Cardiovascular: Regular rate and rhythm, no murmurs / rubs / gallops. No extremity edema. 2+ pedal pulses. No carotid bruits.  Abdomen: no tenderness, no masses palpated. No hepatosplenomegaly. Bowel sounds positive.  Musculoskeletal: no clubbing / cyanosis. No joint deformity upper and lower extremities. Good ROM, no contractures. Normal muscle tone.  Skin: no rashes, lesions, ulcers. No induration Neurologic: CN 2-12 grossly intact. Sensation intact, DTR normal. Strength 5/5 in all 4.  Psychiatric: Normal judgment and insight. Alert and oriented x 3. Normal mood.    Labs on Admission: I have personally reviewed following labs and imaging studies  CBC: Recent Labs  Lab 03/25/19 0111 03/31/19 1155  WBC 9.7 27.7*  NEUTROABS 7.8* 24.1*  HGB 14.3 15.6*  HCT 42.1 43.5  MCV 85.6 82.2  PLT 266 569*   Basic Metabolic Panel: Recent Labs  Lab 03/25/19 0111 03/31/19 1155  NA 132* 122*  K 3.6 3.9  CL 99 85*  CO2 22 19*  GLUCOSE 277* 675*  BUN 9 25*  CREATININE 0.62 0.93  CALCIUM 8.9 9.5   GFR: Estimated Creatinine Clearance: 92.8 mL/min (by C-G formula based on SCr of 0.93 mg/dL). Liver Function Tests: Recent Labs  Lab 03/25/19  0111 03/31/19 1155  AST 69* 41  ALT 80* 177*  ALKPHOS 128* 169*  BILITOT 1.1 1.1  PROT 8.0 7.7  ALBUMIN 3.8 3.8   No results for input(s): LIPASE, AMYLASE in the last 168 hours. No results for input(s): AMMONIA in the last 168 hours. Coagulation Profile: No results for input(s): INR, PROTIME in the last 168 hours. Cardiac Enzymes: No results for input(s): CKTOTAL, CKMB, CKMBINDEX, TROPONINI in the last 168 hours. BNP (last 3 results) No results for input(s): PROBNP in the last 8760 hours. HbA1C: No results for input(s): HGBA1C in the last 72 hours. CBG: Recent Labs  Lab 03/31/19 1822 03/31/19 1938 03/31/19 2050 03/31/19 2152 03/31/19 2248  GLUCAP 441* 396* 314* 286* 259*   Lipid Profile: No results for input(s): CHOL, HDL, LDLCALC, TRIG, CHOLHDL, LDLDIRECT in the last 72 hours. Thyroid Function Tests: No results for input(s): TSH, T4TOTAL, FREET4, T3FREE, THYROIDAB in the last 72 hours. Anemia Panel: No results for input(s): VITAMINB12, FOLATE, FERRITIN, TIBC, IRON, RETICCTPCT in the last 72 hours. Urine analysis:    Component Value Date/Time   COLORURINE YELLOW (A) 03/31/2019 1241   APPEARANCEUR HAZY (A) 03/31/2019 1241   LABSPEC 1.026 03/31/2019 1241   PHURINE 6.0 03/31/2019 1241   GLUCOSEU >=500 (A) 03/31/2019 1241   HGBUR NEGATIVE 03/31/2019 1241   BILIRUBINUR NEGATIVE 03/31/2019 1241   KETONESUR NEGATIVE 03/31/2019 1241   PROTEINUR NEGATIVE 03/31/2019 1241   NITRITE NEGATIVE 03/31/2019 1241   LEUKOCYTESUR NEGATIVE 03/31/2019 1241   Sepsis Labs: !!!!!!!!!!!!!!!!!!!!!!!!!!!!!!!!!!!!!!!!!!!! (procalcitonin:4,lacticidven:4) ) Recent Results (from the past 240 hour(s))  Blood Culture (routine x 2)     Status: None   Collection Time: 03/25/19  1:11 AM   Specimen: BLOOD  Result Value Ref Range Status   Specimen Description BLOOD BLOOD LEFT WRIST  Final   Special Requests   Final    BOTTLES DRAWN AEROBIC AND ANAEROBIC Blood Culture adequate volume    Culture   Final    NO GROWTH 5 DAYS Performed at [facility], [address], [place], [place] [address]    Report Status 03/30/2019 FINAL  Final  Urine culture     Status: Abnormal   Collection Time: 03/25/19  1:11 AM   Specimen: In/Out Cath Urine  Result Value Ref Range Status   Specimen Description   Final    IN/OUT CATH URINE Performed at [facility], [address], [place], [place] [address]    Special Requests   Final    NONE Performed at [facility], [address], [place], [place] [address]    Culture MULTIPLE SPECIES PRESENT, SUGGEST RECOLLECTION (A)  Final   Report Status 03/26/2019 FINAL  Final  SARS Coronavirus 2 [facility] order, Performed in [facility] hospital lab) Nasopharyngeal Nasopharyngeal Swab     Status: Abnormal   Collection Time: 03/25/19  1:11 AM   Specimen: Nasopharyngeal Swab  Result Value Ref Range Status   SARS Coronavirus 2 POSITIVE (A) NEGATIVE Final    Comment: RESULT CALLED TO, READ BACK BY AND VERIFIED  WITH: DANIEL LEWIS RN AT 0327 ON 03/25/2019 SNG (NOTE) If result is NEGATIVE SARS-CoV-2 target nucleic acids are NOT DETECTED. The SARS-CoV-2 RNA is generally detectable in upper and lower  respiratory specimens during the acute phase of infection. The lowest  concentration of SARS-CoV-2 viral copies this assay can detect is 250  copies / mL. A negative result does not preclude SARS-CoV-2 infection  and should not be used as the sole basis for treatment or other  patient management decisions.  A negative result may occur with  improper specimen collection / handling, submission of specimen other  than nasopharyngeal swab, presence of viral mutation(s) within the  areas targeted by this assay, and inadequate number of viral copies  (<250 copies / mL). A negative result must be combined with clinical  observations, patient history, and epidemiological information. If result is POSITIVE SARS-CoV-2 target nucleic  acids are DETECTE D. The SARS-CoV-2 RNA is generally detectable in upper and lower  respiratory specimens during the acute phase of infection.  Positive  results are indicative of active infection with SARS-CoV-2.  Clinical  correlation with patient history and other diagnostic information is  necessary to determine patient infection status.  Positive results do  not rule out bacterial infection or co-infection with other viruses. If result is PRESUMPTIVE POSTIVE SARS-CoV-2 nucleic acids MAY BE PRESENT.   A presumptive positive result was obtained on the submitted specimen  and confirmed on repeat testing.  While 2019 novel coronavirus  (SARS-CoV-2) nucleic acids may be present in the submitted sample  additional confirmatory testing may be necessary for epidemiological  and / or clinical management purposes  to differentiate between  SARS-CoV-2 and other Sarbecovirus currently known to infect humans.  If clinically indicated additional testing with an alternate test  methodology (LAB745 3) is advised. The SARS-CoV-2 RNA is generally  detectable in upper and lower respiratory specimens during the acute  phase of infection. The expected result is Negative. Fact Sheet for Patients:  BoilerBrush.com.cy Fact Sheet for Healthcare Providers: https://pope.com/ This test is not yet approved or cleared by the [place] FDA and has been authorized for detection and/or diagnosis of SARS-CoV-2 by FDA under an Emergency Use Authorization (EUA).  This EUA will remain in effect (meaning this test can be used) for the duration of the COVID-19 declaration under Section 564(b)(1) of the Act, 21 U.S.C. section 360bbb-3(b)(1), unless the authorization is terminated or revoked sooner. Performed at [facility], [address] [place], [place] [address]   Blood Culture (routine x 2)     Status: None   Collection Time: 03/25/19  1:12 AM    Specimen: BLOOD  Result Value Ref Range Status   Specimen Description BLOOD BLOOD RIGHT WRIST  Final   Special Requests   Final    BOTTLES DRAWN AEROBIC AND ANAEROBIC Blood Culture results may not be optimal due to an excessive volume of blood received in culture bottles   Culture   Final    NO GROWTH 5 DAYS Performed at [facility], [address], [place], [place] [address]    Report Status 03/30/2019 FINAL  Final     Radiological Exams on Admission: No results found.  Old chart reviewed  Assessment/Plan 48 yo female with recent covid 19 infection treated with oral steroids comes in with dka (h/o dm on oral only at home)  Principal Problem:   DKA (diabetic ketoacidoses) (HCC)- insulin drip.  No repeat bmp done all day, repeat stat now.  Gap  likely will be closed, if so will transition to ssi.  Until then hourly glucose and cont insulin drip.  Steroids finished today.  May need ssi at home for 4 or 5 days at d/c  Active Problems:   COVID-19 virus infection- improved.  sats nml and minimal symptoms at this time.  No further treatment.     DVT prophylaxis: lovenox Code Status: full Family Communication: none Disposition Plan:  A day Consults called:  none Admission status:  observation   DAVID,RACHAL A MD [facility]  If 7PM-7AM, please contact night-coverage [email or web address] Password [email or web address]  03/31/2019, 11:49 PM

## 2019-03-31 NOTE — ED Notes (Signed)
E-signature pad not working at time of transfer. Received verbal consent for transfer from patient and have now signed as a witness.

## 2019-03-31 NOTE — ED Notes (Signed)
Pt assisted to toilet; NAD noted. Pt requests ice chips and a blanket.

## 2019-03-31 NOTE — ED Provider Notes (Signed)
[facility] Emergency Department Provider Note    First MD Initiated Contact with Patient 03/31/19 1200     (approximate)  I have reviewed the triage vital signs and the nursing notes.   HISTORY  Chief Complaint Hyperglycemia    HPI Amanda Shepard is a 48 y.o. female the listed past medical history on metformin as well as glipizide recently started on antibiotics and steroids for COVID-19 pneumonitis presents the ER for generalized fatigue elevated blood sugar and feeling weak.  States that she has been feel like she has to use the bathroom constantly over the past 48 hours.  States that she is not been able to control her blood sugar.  Denies any nausea or vomiting.  Denies any chest pain.  Does have some shortness of breath.    Past Medical History:  Diagnosis Date  . Allergy   . Diabetes mellitus without complication (HCC)   . Hypertension    Family History  Problem Relation Age of Onset  . Diabetes Father   . Diabetes Brother    Past Surgical History:  Procedure Laterality Date  . ECTOPIC PREGNANCY SURGERY     There are no active problems to display for this patient.     Prior to Admission medications   Medication Sig Start Date End Date Taking? Authorizing Provider  albuterol (VENTOLIN HFA) 108 (90 Base) MCG/ACT inhaler Inhale 2 puffs into the lungs every 4 (four) hours as needed for wheezing or shortness of breath. 03/25/19   Irean Hong, MD  atorvastatin (LIPITOR) 40 MG tablet Take 40 mg by mouth daily. 10/25/16 10/25/17  [provider]  azithromycin (ZITHROMAX) 250 MG tablet Take 1 tablet (250 mg total) by mouth daily. 03/25/19   Irean Hong, MD  cephALEXin (KEFLEX) 500 MG capsule Take 1 capsule (500 mg total) by mouth 3 (three) times daily. 03/25/19   Irean Hong, MD  chlorpheniramine-HYDROcodone (TUSSIONEX PENNKINETIC ER) 10-8 MG/5ML SUER Take 5 mLs by mouth 2 (two) times daily. 03/25/19   Irean Hong, MD  glipiZIDE (GLUCOTROL XL)  10 MG 24 hr tablet Take 10 mg by mouth 2 (two) times daily. 10/25/16 10/25/17  [provider]  lisinopril-hydrochlorothiazide (PRINZIDE,ZESTORETIC) 20-25 MG tablet Take 1 tablet by mouth daily. 10/25/16 10/25/17  [provider]  metFORMIN (GLUCOPHAGE) 500 MG tablet Take 500 mg by mouth 2 (two) times daily. 01/26/19   [provider]  montelukast (SINGULAIR) 10 MG tablet Take 10 mg by mouth daily. 10/25/16 10/25/17  [provider]  predniSONE (DELTASONE) 20 MG tablet 3 tablets x 5 days 03/25/19   Irean Hong, MD    Allergies Patient has no known allergies.    Social History Social History   Tobacco Use  . Smoking status: Never Smoker  . Smokeless tobacco: Never Used  Substance Use Topics  . Alcohol use: No  . Drug use: Never    Review of Systems Patient denies headaches, rhinorrhea, blurry vision, numbness, shortness of breath, chest pain, edema, cough, abdominal pain, nausea, vomiting, diarrhea, dysuria, fevers, rashes or hallucinations unless otherwise stated above in HPI. ____________________________________________   PHYSICAL EXAM:  VITAL SIGNS: Vitals:   03/31/19 1615 03/31/19 1630  BP:  (!) 150/86  Pulse: 68 71  Resp: (!) 27 (!) 25  Temp:    SpO2: 98% 97%    Constitutional: Alert and oriented.  Eyes: Conjunctivae are normal.  Head: Atraumatic. Nose: No congestion/rhinnorhea. Mouth/Throat: Mucous membranes are dry.   Neck: No  stridor. Painless ROM.  Cardiovascular: Normal rate, regular rhythm. Grossly normal heart sounds.  Good peripheral circulation. Respiratory: Normal respiratory effort.  No retractions. Lungs CTAB. Gastrointestinal: Soft and nontender. No distention. No abdominal bruits. No CVA tenderness. Genitourinary:  Musculoskeletal: No lower extremity tenderness nor edema.  No joint effusions. Neurologic:  Normal speech and language. No gross focal neurologic deficits are appreciated. No facial droop Skin:  Skin is warm, dry  and intact. No rash noted. Psychiatric: Mood and affect are normal. Speech and behavior are normal.  ____________________________________________   LABS (all labs ordered are listed, but only abnormal results are displayed)  Results for orders placed or performed during the hospital encounter of 03/31/19 (from the past 24 hour(s))  CBC with Differential/Platelet     Status: Abnormal   Collection Time: 03/31/19 11:55 AM  Result Value Ref Range   WBC 27.7 (H) 4.0 - 10.5 K/uL   RBC 5.29 (H) 3.87 - 5.11 MIL/uL   Hemoglobin 15.6 (H) 12.0 - 15.0 g/dL   HCT 56.3 87.5 - 64.3 %   MCV 82.2 80.0 - 100.0 fL   MCH 29.5 26.0 - 34.0 pg   MCHC 35.9 30.0 - 36.0 g/dL   RDW 32.9 51.8 - 84.1 %   Platelets 569 (H) 150 - 400 K/uL   nRBC 0.0 0.0 - 0.2 %   Neutrophils Relative % 88 %   Neutro Abs 24.1 (H) 1.7 - 7.7 K/uL   Lymphocytes Relative 5 %   Lymphs Abs 1.4 0.7 - 4.0 K/uL   Monocytes Relative 4 %   Monocytes Absolute 1.2 (H) 0.1 - 1.0 K/uL   Eosinophils Relative 0 %   Eosinophils Absolute 0.1 0.0 - 0.5 K/uL   Basophils Relative 0 %   Basophils Absolute 0.1 0.0 - 0.1 K/uL   WBC Morphology MORPHOLOGY UNREMARKABLE    RBC Morphology MORPHOLOGY UNREMARKABLE    Smear Review Normal platelet morphology    Immature Granulocytes 3 %   Abs Immature Granulocytes 0.91 (H) 0.00 - 0.07 K/uL  Comprehensive metabolic panel     Status: Abnormal   Collection Time: 03/31/19 11:55 AM  Result Value Ref Range   Sodium 122 (L) 135 - 145 mmol/L   Potassium 3.9 3.5 - 5.1 mmol/L   Chloride 85 (L) 98 - 111 mmol/L   CO2 19 (L) 22 - 32 mmol/L   Glucose, Bld 675 (HH) 70 - 99 mg/dL   BUN 25 (H) 6 - 20 mg/dL   Creatinine, Ser 6.60 0.44 - 1.00 mg/dL   Calcium 9.5 8.9 - 63.0 mg/dL   Total Protein 7.7 6.5 - 8.1 g/dL   Albumin 3.8 3.5 - 5.0 g/dL   AST 41 15 - 41 U/L   ALT 177 (H) 0 - 44 U/L   Alkaline Phosphatase 169 (H) 38 - 126 U/L   Total Bilirubin 1.1 0.3 - 1.2 mg/dL   GFR calc non Af Amer >60 >60 mL/min   GFR  calc Af Amer >60 >60 mL/min   Anion gap 18 (H) 5 - 15  Glucose, capillary     Status: Abnormal   Collection Time: 03/31/19 11:58 AM  Result Value Ref Range   Glucose-Capillary >600 (HH) 70 - 99 mg/dL   Comment 1 Notify RN   Urinalysis, Complete w Microscopic     Status: Abnormal   Collection Time: 03/31/19 12:41 PM  Result Value Ref Range   Color, Urine YELLOW (A) YELLOW   APPearance HAZY (A) CLEAR   Specific Gravity, Urine  1.026 1.005 - 1.030   pH 6.0 5.0 - 8.0   Glucose, UA >=500 (A) NEGATIVE mg/dL   Hgb urine dipstick NEGATIVE NEGATIVE   Bilirubin Urine NEGATIVE NEGATIVE   Ketones, ur NEGATIVE NEGATIVE mg/dL   Protein, ur NEGATIVE NEGATIVE mg/dL   Nitrite NEGATIVE NEGATIVE   Leukocytes,Ua NEGATIVE NEGATIVE   RBC / HPF 0-5 0 - 5 RBC/hpf   WBC, UA 6-10 0 - 5 WBC/hpf   Bacteria, UA NONE SEEN NONE SEEN   Squamous Epithelial / LPF 6-10 0 - 5  Glucose, capillary     Status: Abnormal   Collection Time: 03/31/19  3:19 PM  Result Value Ref Range   Glucose-Capillary >600 (HH) 70 - 99 mg/dL   Comment 1 Notify RN    Comment 2 Document in Chart    ____________________________________________  EKG My review and personal interpretation at Time: 11:53   Indication: hyperglycemia  Rate: 115  Rhythm: sinus Axis: normal Other: normal intervals, no stemi ____________________________________________  RADIOLOGY    ____________________________________________   PROCEDURES  Procedure(s) performed:  .Critical Care Performed by: Merlyn Lot, MD Authorized by: Merlyn Lot, MD   Critical care provider statement:    Critical care time (minutes):  30   Critical care time was exclusive of:  Separately billable procedures and treating other patients   Critical care was necessary to treat or prevent imminent or life-threatening deterioration of the following conditions:  Metabolic crisis and dehydration   Critical care was time spent personally by me on the following  activities:  Development of treatment plan with patient or surrogate, discussions with consultants, evaluation of patient's response to treatment, examination of patient, obtaining history from patient or surrogate, ordering and performing treatments and interventions, ordering and review of laboratory studies, ordering and review of radiographic studies, pulse oximetry, re-evaluation of patient's condition and review of old charts      Critical Care performed: no ____________________________________________   INITIAL IMPRESSION / ASSESSMENT AND PLAN / ED COURSE  Pertinent labs & imaging results that were available during my care of the patient were reviewed by me and considered in my medical decision making (see chart for details).   DDX: hhns, dka, hyperglycemia, dehydration, n oncompliance  Vondell Sowell is a 48 y.o. who presents to the ED with nausea generalized malaise fatigue increased urinary frequency and concern for dehydration.  Patient is markedly hyperglycemic.  Does appear clinically dehydrated does show hemoconcentration on CBC.  We will give IV fluids as well as IV insulin.  Symptoms most likely secondary to recent steroid use in the setting of COVID-19.  Clinical Course as of Mar 30 1729  Mon Mar 31, 2019  1241 Patient with borderline acidosis concerning for early DKA.  Will give IV insulin IV fluids will check urinalysis to evaluate for ketones.   [PR]  1320 No ketones on urine.  Seems to be pure hyperglycemia.  Will give IV fluids and reassess.   [PR]  2703 Patient with persistently critically elevated glucose in the setting of her steroid use for COVID-19.  After receiving aggressive IV fluids and IV insulin her glucose is still elevated will place on insulin drip.  Will discuss with hospitalist for admission for hypoglycemia management.   [PR]    Clinical Course User Index [PR] Merlyn Lot, MD    The patient was evaluated in Emergency Department today for the  symptoms described in the history of present illness. He/she was evaluated in the context of the global COVID-19 pandemic, which  necessitated consideration that the patient might be at risk for infection with the SARS-CoV-2 virus that causes COVID-19. Institutional protocols and algorithms that pertain to the evaluation of patients at risk for COVID-19 are in a state of rapid change based on information released by regulatory bodies including the CDC and federal and state organizations. These policies and algorithms were followed during the patient's care in the ED.  As part of my medical decision making, I reviewed the following data within the electronic MEDICAL RECORD NUMBER Nursing notes reviewed and incorporated, Labs reviewed, notes from prior ED visits and [place] Controlled Substance Database   ____________________________________________   FINAL CLINICAL IMPRESSION(S) / ED DIAGNOSES  Final diagnoses:  Hyperglycemia  COVID-19 virus infection      NEW MEDICATIONS STARTED DURING THIS VISIT:  New Prescriptions   No medications on file     Note:  This document was prepared using Dragon voice recognition software and may include unintentional dictation errors.    Willy Eddyobinson, Zarianna Dicarlo, MD 03/31/19 [phone]

## 2019-03-31 NOTE — ED Notes (Signed)
  CBG HI 

## 2019-03-31 NOTE — ED Triage Notes (Signed)
Pt presents from home via [facility] with c/o hyperglycemia. EMS reports blood sugar of 540. Pt has hx of type 2 diabetes. Pt reports taking metformin and glipizide for diabetes. Pt compliant with medications. Pt was recently covid positive, but has been fever free for last three days.

## 2019-04-01 DIAGNOSIS — I10 Essential (primary) hypertension: Secondary | ICD-10-CM | POA: Diagnosis present

## 2019-04-01 DIAGNOSIS — Z7984 Long term (current) use of oral hypoglycemic drugs: Secondary | ICD-10-CM | POA: Diagnosis not present

## 2019-04-01 DIAGNOSIS — Z9114 Patient's other noncompliance with medication regimen: Secondary | ICD-10-CM | POA: Diagnosis not present

## 2019-04-01 DIAGNOSIS — E1110 Type 2 diabetes mellitus with ketoacidosis without coma: Secondary | ICD-10-CM | POA: Diagnosis present

## 2019-04-01 DIAGNOSIS — E860 Dehydration: Secondary | ICD-10-CM | POA: Diagnosis present

## 2019-04-01 DIAGNOSIS — Z833 Family history of diabetes mellitus: Secondary | ICD-10-CM | POA: Diagnosis not present

## 2019-04-01 DIAGNOSIS — J45909 Unspecified asthma, uncomplicated: Secondary | ICD-10-CM | POA: Diagnosis present

## 2019-04-01 DIAGNOSIS — Z79899 Other long term (current) drug therapy: Secondary | ICD-10-CM | POA: Diagnosis not present

## 2019-04-01 DIAGNOSIS — T380X5A Adverse effect of glucocorticoids and synthetic analogues, initial encounter: Secondary | ICD-10-CM | POA: Diagnosis present

## 2019-04-01 DIAGNOSIS — N179 Acute kidney failure, unspecified: Secondary | ICD-10-CM | POA: Diagnosis not present

## 2019-04-01 DIAGNOSIS — U071 COVID-19: Secondary | ICD-10-CM | POA: Diagnosis present

## 2019-04-01 LAB — BASIC METABOLIC PANEL
Anion gap: 10 (ref 5–15)
Anion gap: 4 — ABNORMAL LOW (ref 5–15)
BUN: 16 mg/dL (ref 6–20)
BUN: 13 mg/dL (ref 6–20)
CO2: 22 mmol/L (ref 22–32)
CO2: 30 mmol/L (ref 22–32)
Calcium: 8.7 mg/dL — ABNORMAL LOW (ref 8.9–10.3)
Calcium: 8.7 mg/dL — ABNORMAL LOW (ref 8.9–10.3)
Chloride: 102 mmol/L (ref 98–111)
Chloride: 100 mmol/L (ref 98–111)
Creatinine, Ser: 0.86 mg/dL (ref 0.44–1.00)
Creatinine, Ser: 0.78 mg/dL (ref 0.44–1.00)
GFR calc Af Amer: >60 mL/min (ref >60)
GFR calc Af Amer: >60 mL/min (ref >60)
GFR calc non Af Amer: >60 mL/min (ref >60)
GFR calc non Af Amer: >60 mL/min (ref >60)
Glucose, Bld: 195 mg/dL — ABNORMAL HIGH (ref 70–99)
Glucose, Bld: 169 mg/dL — ABNORMAL HIGH (ref 70–99)
Potassium: 3.7 mmol/L (ref 3.5–5.1)
Potassium: 3.8 mmol/L (ref 3.5–5.1)
Sodium: 134 mmol/L — ABNORMAL LOW (ref 135–145)
Sodium: 134 mmol/L — ABNORMAL LOW (ref 135–145)

## 2019-04-01 LAB — CBC
HCT: 38.6 % (ref 36.0–46.0)
Hemoglobin: 13.6 g/dL (ref 12.0–15.0)
MCH: 29.6 pg (ref 26.0–34.0)
MCHC: 35.2 g/dL (ref 30.0–36.0)
MCV: 84.1 fL (ref 80.0–100.0)
Platelets: 397 10*3/uL (ref 150–400)
RBC: 4.59 MIL/uL (ref 3.87–5.11)
RDW: 12.2 % (ref 11.5–15.5)
WBC: 20.5 10*3/uL — ABNORMAL HIGH (ref 4.0–10.5)
nRBC: 0 % (ref 0.0–0.2)

## 2019-04-01 LAB — HEMOGLOBIN A1C
Hgb A1c MFr Bld: 10.8 % — ABNORMAL HIGH (ref 4.8–5.6)
Mean Plasma Glucose: 263.26 mg/dL

## 2019-04-01 LAB — HIV ANTIBODY (ROUTINE TESTING W REFLEX): HIV Screen 4th Generation wRfx: NONREACTIVE

## 2019-04-01 LAB — GLUCOSE, CAPILLARY
Glucose-Capillary: 142 mg/dL — ABNORMAL HIGH (ref 70–99)
Glucose-Capillary: 147 mg/dL — ABNORMAL HIGH (ref 70–99)
Glucose-Capillary: 231 mg/dL — ABNORMAL HIGH (ref 70–99)
Glucose-Capillary: 206 mg/dL — ABNORMAL HIGH (ref 70–99)
Glucose-Capillary: 160 mg/dL — ABNORMAL HIGH (ref 70–99)
Glucose-Capillary: 312 mg/dL — ABNORMAL HIGH (ref 70–99)
Glucose-Capillary: 227 mg/dL — ABNORMAL HIGH (ref 70–99)
Glucose-Capillary: 261 mg/dL — ABNORMAL HIGH (ref 70–99)
Glucose-Capillary: 309 mg/dL — ABNORMAL HIGH (ref 70–99)

## 2019-04-01 MED ORDER — INSULIN ASPART 100 UNIT/ML ~~LOC~~ SOLN
0.0000 [IU] | Freq: Three times a day (TID) | SUBCUTANEOUS | Status: DC
  Administered 2019-04-01: 7 [IU] via SUBCUTANEOUS
  Administered 2019-04-01: 15 [IU] via SUBCUTANEOUS
  Administered 2019-04-02: 11 [IU] via SUBCUTANEOUS
  Administered 2019-04-02: 15 [IU] via SUBCUTANEOUS
  Administered 2019-04-02: 7 [IU] via SUBCUTANEOUS
  Administered 2019-04-03 (×2): 4 [IU] via SUBCUTANEOUS

## 2019-04-01 MED ORDER — TRAMADOL HCL 50 MG PO TABS: 50.0000 mg | ORAL_TABLET | Freq: Two times a day (BID) | ORAL | Status: DC | PRN

## 2019-04-01 MED ORDER — ACETAMINOPHEN 325 MG PO TABS
650.0000 mg | ORAL_TABLET | ORAL | Status: DC | PRN
  Administered 2019-04-01: 650 mg via ORAL
  Filled 2019-04-01: qty 2

## 2019-04-01 MED ORDER — LISINOPRIL-HYDROCHLOROTHIAZIDE 20-25 MG PO TABS: 1.0000 | ORAL_TABLET | Freq: Every day | ORAL | Status: DC

## 2019-04-01 MED ORDER — MONTELUKAST SODIUM 10 MG PO TABS
10.0000 mg | ORAL_TABLET | Freq: Every day | ORAL | Status: DC
  Administered 2019-04-01 – 2019-04-02 (×2): 10 mg via ORAL
  Filled 2019-04-01 (×2): qty 1

## 2019-04-01 MED ORDER — LISINOPRIL 20 MG PO TABS
20.0000 mg | ORAL_TABLET | Freq: Every day | ORAL | Status: DC
  Administered 2019-04-01 – 2019-04-02 (×2): 20 mg via ORAL
  Filled 2019-04-01 (×3): qty 1

## 2019-04-01 MED ORDER — ALBUTEROL SULFATE HFA 108 (90 BASE) MCG/ACT IN AERS
2.0000 | INHALATION_SPRAY | RESPIRATORY_TRACT | Status: DC | PRN
  Administered 2019-04-02: 2 via RESPIRATORY_TRACT
  Filled 2019-04-01: qty 6.7

## 2019-04-01 MED ORDER — INSULIN STARTER KIT- PEN NEEDLES (ENGLISH)
1.0000 | Freq: Once | Status: DC
  Filled 2019-04-01: qty 1

## 2019-04-01 MED ORDER — INSULIN ASPART 100 UNIT/ML ~~LOC~~ SOLN
0.0000 [IU] | Freq: Three times a day (TID) | SUBCUTANEOUS | Status: DC
  Administered 2019-04-01: 3 [IU] via SUBCUTANEOUS

## 2019-04-01 MED ORDER — INSULIN ASPART 100 UNIT/ML ~~LOC~~ SOLN
0.0000 [IU] | Freq: Every day | SUBCUTANEOUS | Status: DC
  Administered 2019-04-01: 3 [IU] via SUBCUTANEOUS
  Administered 2019-04-02: 2 [IU] via SUBCUTANEOUS

## 2019-04-01 MED ORDER — HYDROCHLOROTHIAZIDE 25 MG PO TABS
25.0000 mg | ORAL_TABLET | Freq: Every day | ORAL | Status: DC
  Administered 2019-04-01 – 2019-04-03 (×3): 25 mg via ORAL
  Filled 2019-04-01 (×3): qty 1

## 2019-04-01 MED ORDER — ONDANSETRON HCL 4 MG/2ML IJ SOLN
4.0000 mg | Freq: Four times a day (QID) | INTRAMUSCULAR | Status: DC | PRN
  Administered 2019-04-01 – 2019-04-02 (×3): 4 mg via INTRAVENOUS
  Filled 2019-04-01 (×3): qty 2

## 2019-04-01 MED ORDER — INSULIN GLARGINE 100 UNIT/ML ~~LOC~~ SOLN
20.0000 [IU] | Freq: Every day | SUBCUTANEOUS | Status: DC
  Administered 2019-04-01 – 2019-04-02 (×2): 20 [IU] via SUBCUTANEOUS
  Filled 2019-04-01 (×2): qty 0.2

## 2019-04-01 MED ORDER — ENSURE MAX PROTEIN PO LIQD
11.0000 [oz_av] | Freq: Two times a day (BID) | ORAL | Status: DC
  Administered 2019-04-01 – 2019-04-02 (×3): 11 [oz_av] via ORAL
  Filled 2019-04-01 (×6): qty 330

## 2019-04-01 MED ORDER — LIVING WELL WITH DIABETES BOOK
Freq: Once | Status: AC
  Administered 2019-04-01: 15:00:00
  Filled 2019-04-01: qty 1

## 2019-04-01 MED ORDER — ATORVASTATIN CALCIUM 40 MG PO TABS
40.0000 mg | ORAL_TABLET | Freq: Every day | ORAL | Status: DC
  Administered 2019-04-01 – 2019-04-03 (×3): 40 mg via ORAL
  Filled 2019-04-01 (×3): qty 1

## 2019-04-01 NOTE — Progress Notes (Signed)
Left a message for Amanda Shepard to call back if she had questions.

## 2019-04-01 NOTE — Progress Notes (Signed)
Initial Nutrition Assessment  DOCUMENTATION CODES:   Not applicable  INTERVENTION:   Recommend outpatient DM education referral   Ensure Max po BID, each supplement provides 150 kcal and 30 grams of protein.    NUTRITION DIAGNOSIS:   Increased nutrient needs related to acute illness(COVID-19) as evidenced by estimated needs.   GOAL:   Patient will meet greater than or equal to 90% of their needs  MONITOR:   PO intake, Supplement acceptance  REASON FOR ASSESSMENT:   Malnutrition Screening Tool    ASSESSMENT:   Pt with PMH of DM (dx 2-3 years ago, non-compliant with meds PTA) and HTN who was dx COVID-19 9/29 and started on prednisone as an outpatient now admitted with DKA.  Per MD notes, plan to go home on insulin. RN has started teaching.   Meal Completion: 50%  Medications reviewed and include: SSI, lantus  Labs reviewed:  CBG's: 206-312-227 Lab Results  Component Value Date   HGBA1C 10.8 (H) 04/01/2019    NUTRITION - FOCUSED PHYSICAL EXAM:  Deferred; RD working remotely   Diet Order:   Diet Order            Diet Carb Modified Fluid consistency: Thin; Room service appropriate? Yes  Diet effective now              EDUCATION NEEDS:   Education needs have been addressed  Skin:  Skin Assessment: (MASD: buttocks (d/t diarrhea))  Last BM:  10/6  Height:   Ht Readings from Last 1 Encounters:  03/31/19 5\' 11"  (1.803 m)    Weight:   Wt Readings from Last 1 Encounters:  03/31/19 92.5 kg    Ideal Body Weight:  70.4 kg  BMI:  Body mass index is 28.45 kg/m.  Estimated Nutritional Needs:   Kcal:  2200-2400  Protein:  105-125 grams  Fluid:  2L/day  Maylon Peppers RD, [place], [place] Pager [phone] After Hours Pager

## 2019-04-01 NOTE — Progress Notes (Signed)
Started insulin pen injection training with the demo pens at the nurses' station.  Patient verified teaching with demonstration teach back.  Booklet with diabetic info given when received from pharmacy.  Patient to receive kit tomorrow per pharmacy before discharge.

## 2019-04-01 NOTE — Progress Notes (Signed)
Inpatient Diabetes Program Recommendations  AACE/ADA: New Consensus Statement on Inpatient Glycemic Control (2015)  Target Ranges:  Prepandial:   less than 140 mg/dL      Peak postprandial:   less than 180 mg/dL (1-2 hours)      Critically ill patients:  140 - 180 mg/dL   Lab Results  Component Value Date   GLUCAP 312 (H) 04/01/2019   HGBA1C 10.8 (H) 04/01/2019    Review of Glycemic Control Results for Amanda Shepard, Amanda Shepard (MRN [id number]) as of 04/01/2019 13:16  Ref. Range 04/01/2019 02:09 04/01/2019 03:38 04/01/2019 07:38 04/01/2019 11:12  Glucose-Capillary Latest Ref Range: 70 - 99 mg/dL 142 (H) 160 (H) 206 (H) 312 (H)   A1c 10.8 (average blood glucose 263 over the past 2-3 months)  Diabetes history: DM2 Outpatient Diabetes medications: Glucotrol 10 mg bid + Metformin 500 mg bid Current orders for Inpatient glycemic control: Lantus 20 units + Novolog resistant correction tid + hs 0-5 units  Inpatient Diabetes Program Recommendations:   Ordered Living Well With Diabetes Book and insulin starter kits with insulin pen needles.  Nurses, please begin insulin injection training with patient and allow to give own injections as appropriate. Secure message sent to RN Ruta Hinds to request to start insulin injection teaching.  Spoke with patient concerning A1c 10.8 and reviewed she would begin training on giving injections to prepare for home regimen but patient had just gotten up out of the bed and not feeling well. Requests callback tomorrow.  Thank you, Nani Gasser. Kincaid Tiger, RN, MSN, CDE  Diabetes Coordinator Inpatient Glycemic Control Team Team Pager [phone] (8am-5pm) 04/01/2019 1:35 PM

## 2019-04-01 NOTE — Progress Notes (Signed)
PROGRESS NOTE  Amanda Shepard  NID:[id number] DOB: Aug 29, 1970 DOA: 03/31/2019 PCP: Ricardo Jericho, NP   Brief Narrative: Amanda Shepard is a 48 y.o. female with a history of T2DM, HTN, mild asthma diagnosed with covid-19 on 9/29 and placed on prednisone '60mg'$  as an outpatient who presented on 10/5 to the ED with high blood sugar, polyuria, polydipsia, and fatigue. Found to be severely hyperglycemic with elevated anion gap without urine ketones. She was afebrile without hypoxia. Insulin was started and patient admitted to [facility]. With insulin and IV fluids, gap closed and blood sugars improved. Once taken off the infusion, blood sugars have again risen despite bolus insulin administration.   Assessment & Plan: Principal Problem:   DKA (diabetic ketoacidoses) ([place]) Active Problems:   COVID-19 virus infection   DKA (diabetic ketoacidosis) ([place])  T2DM with DKA and steroid-induced hyperglycemia: Dx 2-3 years ago, never on insulin. Incompletely compliant with po medications PTA.  - Continue lantus and augment to resistant SSI.  - Diabetes coordinator consulted for education.  - RN to teach administration of insulin as at least lantus will be required going forward based on HbA1c 10.8%.  - Holding home glipizide and metformin.   Covid-19 infection: Has completed steroid treatment as outpatient. Currently asymptomatic. - Ok to stop steroids, no indication for escalation to remdesivir. - Airborne/contact precautions - Avoid NSAIDs.   Asthma:  - Restart prn albuterol and singulair, no exacerbation currently  HTN: BPs elevated. - Restart home lisinopril and HCTZ  DVT prophylaxis: Lovenox Code Status: Full Family Communication: None at bedside Disposition Plan: Patient remains polydipsic, polyuric and blood glucose has only risen since discontinuation of insulin gtt despite getting basal-bolus insulin. Will augment to resistant sliding scale and repeat lantus dosing this afternoon. Pt has  never administered insulin and will require titration of appropriate dose as well as education regarding insulin administration prior to discharge. Will keep an additional 24 hours to accomplish these goals.   Consultants:   None  Procedures:   None  Antimicrobials:  None   Subjective: Feels fatigued, having to drink constantly to keep mouth from being too dry. Able to eat. No vomiting. No shortness of breath.  Objective: Vitals:   03/31/19 2300 04/01/19 0349 04/01/19 0815 04/01/19 1206  BP: (!) 151/86 137/78 (!) 152/81   Pulse: (!) 54 62 66   Resp: 19 (!) 23  18  Temp: 99.1 F (37.3 C) 99.4 F (37.4 C) 98.3 F (36.8 C) 98.6 F (37 C)  TempSrc: Oral Oral Oral Oral  SpO2: 97% 99%    Weight: 92.5 kg     Height: '5\' 11"'$  (1.803 m)       Intake/Output Summary (Last 24 hours) at 04/01/2019 1527 Last data filed at 04/01/2019 1303 Gross per 24 hour  Intake 1434.3 ml  Output -  Net 1434.3 ml   Filed Weights   03/31/19 2300  Weight: 92.5 kg    Gen: 48 y.o. female in no distress  Pulm: Non-labored breathing room air. Clear to auscultation bilaterally.  CV: Regular rate and rhythm. No murmur, rub, or gallop. No JVD, no pedal edema. GI: Abdomen soft, non-tender, non-distended, with normoactive bowel sounds. No organomegaly or masses felt. Ext: Warm, no deformities Skin: No rashes, lesions or ulcers Neuro: Alert and oriented. No focal neurological deficits. Psych: Judgement and insight appear normal. Mood & affect appropriate.   Data Reviewed: I have personally reviewed following labs and imaging studies  CBC: Recent Labs  Lab 03/31/19 1155 04/01/19 0010  WBC 27.7* 20.5*  NEUTROABS 24.1*  --   HGB 15.6* 13.6  HCT 43.5 38.6  MCV 82.2 84.1  PLT 569* 336   Basic Metabolic Panel: Recent Labs  Lab 03/31/19 1155 04/01/19 0010 04/01/19 0540  NA 122* 134* 134*  K 3.9 3.7 3.8  CL 85* 102 100  CO2 19* 22 30  GLUCOSE 675* 195* 169*  BUN 25* 16 13  CREATININE 0.93  0.86 0.78  CALCIUM 9.5 8.7* 8.7*   GFR: Estimated Creatinine Clearance: 107.9 mL/min (by C-G formula based on SCr of 0.78 mg/dL). Liver Function Tests: Recent Labs  Lab 03/31/19 1155  AST 41  ALT 177*  ALKPHOS 169*  BILITOT 1.1  PROT 7.7  ALBUMIN 3.8   No results for input(s): LIPASE, AMYLASE in the last 168 hours. No results for input(s): AMMONIA in the last 168 hours. Coagulation Profile: No results for input(s): INR, PROTIME in the last 168 hours. Cardiac Enzymes: No results for input(s): CKTOTAL, CKMB, CKMBINDEX, TROPONINI in the last 168 hours. BNP (last 3 results) No results for input(s): PROBNP in the last 8760 hours. HbA1C: Recent Labs    04/01/19 0010  HGBA1C 10.8*   CBG: Recent Labs  Lab 04/01/19 0107 04/01/19 0209 04/01/19 0338 04/01/19 0738 04/01/19 1112  GLUCAP 147* 142* 160* 206* 312*   Lipid Profile: No results for input(s): CHOL, HDL, LDLCALC, TRIG, CHOLHDL, LDLDIRECT in the last 72 hours. Thyroid Function Tests: No results for input(s): TSH, T4TOTAL, FREET4, T3FREE, THYROIDAB in the last 72 hours. Anemia Panel: No results for input(s): VITAMINB12, FOLATE, FERRITIN, TIBC, IRON, RETICCTPCT in the last 72 hours. Urine analysis:    Component Value Date/Time   COLORURINE YELLOW (A) 03/31/2019 1241   APPEARANCEUR HAZY (A) 03/31/2019 1241   LABSPEC 1.026 03/31/2019 1241   PHURINE 6.0 03/31/2019 1241   GLUCOSEU >=500 (A) 03/31/2019 1241   HGBUR NEGATIVE 03/31/2019 1241   BILIRUBINUR NEGATIVE 03/31/2019 1241   KETONESUR NEGATIVE 03/31/2019 1241   PROTEINUR NEGATIVE 03/31/2019 1241   NITRITE NEGATIVE 03/31/2019 1241   LEUKOCYTESUR NEGATIVE 03/31/2019 1241   Recent Results (from the past 240 hour(s))  Blood Culture (routine x 2)     Status: None   Collection Time: 03/25/19  1:11 AM   Specimen: BLOOD  Result Value Ref Range Status   Specimen Description BLOOD BLOOD LEFT WRIST  Final   Special Requests   Final    BOTTLES DRAWN AEROBIC AND  ANAEROBIC Blood Culture adequate volume   Culture   Final    NO GROWTH 5 DAYS Performed at [facility], [address] [place], [place] [address]    Report Status 03/30/2019 FINAL  Final  Urine culture     Status: Abnormal   Collection Time: 03/25/19  1:11 AM   Specimen: In/Out Cath Urine  Result Value Ref Range Status   Specimen Description   Final    IN/OUT CATH URINE Performed at [facility], [address], [place], [place] [address]    Special Requests   Final    NONE Performed at [facility], [address] [place], [place] [address]    Culture MULTIPLE SPECIES PRESENT, SUGGEST RECOLLECTION (A)  Final   Report Status 03/26/2019 FINAL  Final  SARS Coronavirus 2 [facility] order, Performed in [facility] hospital lab) Nasopharyngeal Nasopharyngeal Swab     Status: Abnormal   Collection Time: 03/25/19  1:11 AM   Specimen: Nasopharyngeal Swab  Result Value Ref Range Status   SARS Coronavirus  2 POSITIVE (A) NEGATIVE Final    Comment: RESULT CALLED TO, READ BACK BY AND VERIFIED WITH: DANIEL LEWIS RN AT 0327 ON 03/25/2019 SNG (NOTE) If result is NEGATIVE SARS-CoV-2 target nucleic acids are NOT DETECTED. The SARS-CoV-2 RNA is generally detectable in upper and lower  respiratory specimens during the acute phase of infection. The lowest  concentration of SARS-CoV-2 viral copies this assay can detect is 250  copies / mL. A negative result does not preclude SARS-CoV-2 infection  and should not be used as the sole basis for treatment or other  patient management decisions.  A negative result may occur with  improper specimen collection / handling, submission of specimen other  than nasopharyngeal swab, presence of viral mutation(s) within the  areas targeted by this assay, and inadequate number of viral copies  (<250 copies / mL). A negative result must be combined with clinical  observations, patient history, and epidemiological information. If  result is POSITIVE SARS-CoV-2 target nucleic acids are DETECTE D. The SARS-CoV-2 RNA is generally detectable in upper and lower  respiratory specimens during the acute phase of infection.  Positive  results are indicative of active infection with SARS-CoV-2.  Clinical  correlation with patient history and other diagnostic information is  necessary to determine patient infection status.  Positive results do  not rule out bacterial infection or co-infection with other viruses. If result is PRESUMPTIVE POSTIVE SARS-CoV-2 nucleic acids MAY BE PRESENT.   A presumptive positive result was obtained on the submitted specimen  and confirmed on repeat testing.  While 2019 novel coronavirus  (SARS-CoV-2) nucleic acids may be present in the submitted sample  additional confirmatory testing may be necessary for epidemiological  and / or clinical management purposes  to differentiate between  SARS-CoV-2 and other Sarbecovirus currently known to infect humans.  If clinically indicated additional testing with an alternate test  methodology (LAB745 3) is advised. The SARS-CoV-2 RNA is generally  detectable in upper and lower respiratory specimens during the acute  phase of infection. The expected result is Negative. Fact Sheet for Patients:  StrictlyIdeas.no Fact Sheet for Healthcare Providers: BankingDealers.co.za This test is not yet approved or cleared by the [place] FDA and has been authorized for detection and/or diagnosis of SARS-CoV-2 by FDA under an Emergency Use Authorization (EUA).  This EUA will remain in effect (meaning this test can be used) for the duration of the COVID-19 declaration under Section 564(b)(1) of the Act, 21 U.S.C. section 360bbb-3(b)(1), unless the authorization is terminated or revoked sooner. Performed at [facility], [address] [place], [place] [address]   Blood Culture (routine x 2)     Status:  None   Collection Time: 03/25/19  1:12 AM   Specimen: BLOOD  Result Value Ref Range Status   Specimen Description BLOOD BLOOD RIGHT WRIST  Final   Special Requests   Final    BOTTLES DRAWN AEROBIC AND ANAEROBIC Blood Culture results may not be optimal due to an excessive volume of blood received in culture bottles   Culture   Final    NO GROWTH 5 DAYS Performed at [facility], [address] [place], [place] [address]    Report Status 03/30/2019 FINAL  Final      Radiology Studies: No results found.  Scheduled Meds: . enoxaparin (LOVENOX) injection  40 mg Subcutaneous Q24H  . insulin aspart  0-20 Units Subcutaneous TID WC  . insulin aspart  0-5 Units Subcutaneous QHS  . insulin glargine  20 Units Subcutaneous Daily  . [START ON 04/02/2019] insulin starter kit- pen needles  1 kit Other Once  . [START ON 04/05/2019] pneumococcal 23 valent vaccine  0.5 mL Intramuscular Tomorrow-1000   Continuous Infusions:   LOS: 1 day   Time spent: 35 minutes.  Patrecia Pour, MD [facility] [email or web address] Password [email or web address] 04/01/2019, 3:27 PM

## 2019-04-02 LAB — BASIC METABOLIC PANEL
Anion gap: 16 — ABNORMAL HIGH (ref 5–15)
BUN: 9 mg/dL (ref 6–20)
CO2: 25 mmol/L (ref 22–32)
Calcium: 9.5 mg/dL (ref 8.9–10.3)
Chloride: 90 mmol/L — ABNORMAL LOW (ref 98–111)
Creatinine, Ser: 0.87 mg/dL (ref 0.44–1.00)
GFR calc Af Amer: >60 mL/min (ref >60)
GFR calc non Af Amer: >60 mL/min (ref >60)
Glucose, Bld: 230 mg/dL — ABNORMAL HIGH (ref 70–99)
Potassium: 3.5 mmol/L (ref 3.5–5.1)
Sodium: 131 mmol/L — ABNORMAL LOW (ref 135–145)

## 2019-04-02 LAB — GLUCOSE, CAPILLARY
Glucose-Capillary: 256 mg/dL — ABNORMAL HIGH (ref 70–99)
Glucose-Capillary: 321 mg/dL — ABNORMAL HIGH (ref 70–99)
Glucose-Capillary: 217 mg/dL — ABNORMAL HIGH (ref 70–99)
Glucose-Capillary: 217 mg/dL — ABNORMAL HIGH (ref 70–99)

## 2019-04-02 MED ORDER — LINAGLIPTIN 5 MG PO TABS
5.0000 mg | ORAL_TABLET | Freq: Every day | ORAL | Status: DC
  Administered 2019-04-02 – 2019-04-03 (×2): 5 mg via ORAL
  Filled 2019-04-02 (×3): qty 1

## 2019-04-02 MED ORDER — INSULIN GLARGINE 100 UNIT/ML ~~LOC~~ SOLN
8.0000 [IU] | Freq: Once | SUBCUTANEOUS | Status: AC
  Administered 2019-04-02: 8 [IU] via SUBCUTANEOUS
  Filled 2019-04-02: qty 0.08

## 2019-04-02 MED ORDER — INSULIN ASPART 100 UNIT/ML ~~LOC~~ SOLN
4.0000 [IU] | Freq: Three times a day (TID) | SUBCUTANEOUS | Status: DC
  Administered 2019-04-02 – 2019-04-03 (×2): 4 [IU] via SUBCUTANEOUS

## 2019-04-02 MED ORDER — INSULIN GLARGINE 100 UNIT/ML ~~LOC~~ SOLN
28.0000 [IU] | Freq: Every day | SUBCUTANEOUS | Status: DC
  Administered 2019-04-03: 10:00:00 28 [IU] via SUBCUTANEOUS
  Filled 2019-04-02: qty 0.28

## 2019-04-02 NOTE — Progress Notes (Signed)
Family update  Primary RN called patient step daughter Vikki Ports to review POC and d/c plans.

## 2019-04-02 NOTE — Progress Notes (Signed)
[name]  OAC:[id number] DOB: 09-Dec-1970 DOA: 03/31/2019 PCP: Ricardo Jericho, NP    Brief Narrative:  48 year old with a history of DM 2, HTN, and mild asthma who was diagnosed with COVID 9/29 at which time she was placed on prednisone as an outpatient.  She presented to the ED 10/5 with severe hyperglycemia, polyuria, polydipsia, and severe fatigue.  She was afebrile without hypoxia but severely hyperglycemic.  Significant Events: 9/29 SARS-CoV-2 test positive -prednisone initiated 10/5 admit to [facility] via [facility] ED for severe hyperglycemia  COVID-19 specific Treatment: None  Subjective: CBG remains poorly controlled at 256-312.  The patient states she feels very poorly with mild diffuse abdominal cramping and very poor appetite.  She denies chest pain or significant shortness of breath.  Assessment & Plan:  COVID infection Treated with steroids as an outpatient -presently asymptomatic -no indication for Remdesivir or steroid therapy at this time  DM 2 with DKA -steroid-induced hyperglycemia Diagnosed with diabetes 2-3 years ago -has never required insulin therapy -inconsistently compliant with oral medications -A1c 10.8 -intention is for patient to be discharged home on insulin -certainly this was all worsened significantly by the use of steroids, and likely also by COVID itself -continue diabetic teaching -adjust insulin dosing -discharge home when oral intake consistent and CBG better controlled  Asthma Quiescent  HTN Blood pressure currently well controlled  DVT prophylaxis: Lovenox Code Status: FULL CODE Family Communication:  Disposition Plan: MedSurg bed -ambulate  Consultants:  none  Antimicrobials:  None  Objective: Blood pressure (!) 107/58, pulse 92, temperature 98.7 F (37.1 C), temperature source Oral, resp. rate (!) 21, height '5\' 11"'$  (1.803 m), weight 92.5 kg, SpO2 93 %.  Intake/Output Summary (Last  24 hours) at 04/02/2019 0942 Last data filed at 04/02/2019 0800 Gross per 24 hour  Intake 370 ml  Output 1200 ml  Net -830 ml   Filed Weights   03/31/19 2300  Weight: 92.5 kg    Examination: General: No acute respiratory distress Lungs: Clear to auscultation bilaterally without wheezes or crackles Cardiovascular: Regular rate and rhythm without murmur gallop or rub normal S1 and S2 Abdomen: Nontender, nondistended, soft, bowel sounds positive, no rebound, no ascites, no appreciable mass Extremities: No significant cyanosis, clubbing, or edema bilateral lower extremities  CBC: Recent Labs  Lab 03/31/19 1155 04/01/19 0010  WBC 27.7* 20.5*  NEUTROABS 24.1*  --   HGB 15.6* 13.6  HCT 43.5 38.6  MCV 82.2 84.1  PLT 569* 010   Basic Metabolic Panel: Recent Labs  Lab 04/01/19 0010 04/01/19 0540 04/02/19 0200  NA 134* 134* 131*  K 3.7 3.8 3.5  CL 102 100 90*  CO2 '22 30 25  '$ GLUCOSE 195* 169* 230*  BUN '16 13 9  '$ CREATININE 0.86 0.78 0.87  CALCIUM 8.7* 8.7* 9.5   GFR: Estimated Creatinine Clearance: 99.2 mL/min (by C-G formula based on SCr of 0.87 mg/dL).  Liver Function Tests: Recent Labs  Lab 03/31/19 1155  AST 41  ALT 177*  ALKPHOS 169*  BILITOT 1.1  PROT 7.7  ALBUMIN 3.8    HbA1C: Hgb A1c MFr Bld  Date/Time Value Ref Range Status  04/01/2019 12:10 AM 10.8 (H) 4.8 - 5.6 % Final    Comment:    (NOTE) Pre diabetes:          5.7%-6.4% Diabetes:              >6.4% Glycemic control for   <7.0% adults  with diabetes     CBG: Recent Labs  Lab 04/01/19 1112 04/01/19 1622 04/01/19 2058 04/01/19 2147 04/02/19 0732  GLUCAP 312* 227* 261* 309* 256*    Recent Results (from the past 240 hour(s))  Blood Culture (routine x 2)     Status: None   Collection Time: 03/25/19  1:11 AM   Specimen: BLOOD  Result Value Ref Range Status   Specimen Description BLOOD BLOOD LEFT WRIST  Final   Special Requests   Final    BOTTLES DRAWN AEROBIC AND ANAEROBIC Blood  Culture adequate volume   Culture   Final    NO GROWTH 5 DAYS Performed at [facility], [address], [place], [place] [address]    Report Status 03/30/2019 FINAL  Final  Urine culture     Status: Abnormal   Collection Time: 03/25/19  1:11 AM   Specimen: In/Out Cath Urine  Result Value Ref Range Status   Specimen Description   Final    IN/OUT CATH URINE Performed at [facility], [address], [place], [place] [address]    Special Requests   Final    NONE Performed at [facility], [address] [place], [place] [address]    Culture MULTIPLE SPECIES PRESENT, SUGGEST RECOLLECTION (A)  Final   Report Status 03/26/2019 FINAL  Final  SARS Coronavirus 2 [facility] order, Performed in [facility] hospital lab) Nasopharyngeal Nasopharyngeal Swab     Status: Abnormal   Collection Time: 03/25/19  1:11 AM   Specimen: Nasopharyngeal Swab  Result Value Ref Range Status   SARS Coronavirus 2 POSITIVE (A) NEGATIVE Final    Comment: RESULT CALLED TO, READ BACK BY AND VERIFIED WITH: DANIEL LEWIS RN AT 0327 ON 03/25/2019 SNG (NOTE) If result is NEGATIVE SARS-CoV-2 target nucleic acids are NOT DETECTED. The SARS-CoV-2 RNA is generally detectable in upper and lower  respiratory specimens during the acute phase of infection. The lowest  concentration of SARS-CoV-2 viral copies this assay can detect is 250  copies / mL. A negative result does not preclude SARS-CoV-2 infection  and should not be used as the sole basis for treatment or other  patient management decisions.  A negative result may occur with  improper specimen collection / handling, submission of specimen other  than nasopharyngeal swab, presence of viral mutation(s) within the  areas targeted by this assay, and inadequate number of viral copies  (<250 copies / mL). A negative result must be combined with clinical  observations, patient history, and epidemiological information. If result is POSITIVE  SARS-CoV-2 target nucleic acids are DETECTE D. The SARS-CoV-2 RNA is generally detectable in upper and lower  respiratory specimens during the acute phase of infection.  Positive  results are indicative of active infection with SARS-CoV-2.  Clinical  correlation with patient history and other diagnostic information is  necessary to determine patient infection status.  Positive results do  not rule out bacterial infection or co-infection with other viruses. If result is PRESUMPTIVE POSTIVE SARS-CoV-2 nucleic acids MAY BE PRESENT.   A presumptive positive result was obtained on the submitted specimen  and confirmed on repeat testing.  While 2019 novel coronavirus  (SARS-CoV-2) nucleic acids may be present in the submitted sample  additional confirmatory testing may be necessary for epidemiological  and / or clinical management purposes  to differentiate between  SARS-CoV-2 and other Sarbecovirus currently known to infect humans.  If clinically indicated additional testing with an alternate test  methodology (LAB745 3) is  advised. The SARS-CoV-2 RNA is generally  detectable in upper and lower respiratory specimens during the acute  phase of infection. The expected result is Negative. Fact Sheet for Patients:  StrictlyIdeas.no Fact Sheet for Healthcare Providers: BankingDealers.co.za This test is not yet approved or cleared by the [place] FDA and has been authorized for detection and/or diagnosis of SARS-CoV-2 by FDA under an Emergency Use Authorization (EUA).  This EUA will remain in effect (meaning this test can be used) for the duration of the COVID-19 declaration under Section 564(b)(1) of the Act, 21 U.S.C. section 360bbb-3(b)(1), unless the authorization is terminated or revoked sooner. Performed at [facility], [address] [place], [place] [address]   Blood Culture (routine x 2)     Status: None   Collection  Time: 03/25/19  1:12 AM   Specimen: BLOOD  Result Value Ref Range Status   Specimen Description BLOOD BLOOD RIGHT WRIST  Final   Special Requests   Final    BOTTLES DRAWN AEROBIC AND ANAEROBIC Blood Culture results may not be optimal due to an excessive volume of blood received in culture bottles   Culture   Final    NO GROWTH 5 DAYS Performed at [facility], [address] [place], [place] [address]    Report Status 03/30/2019 FINAL  Final     Scheduled Meds: . atorvastatin  40 mg Oral Daily  . enoxaparin (LOVENOX) injection  40 mg Subcutaneous Q24H  . lisinopril  20 mg Oral Daily   And  . hydrochlorothiazide  25 mg Oral Daily  . insulin aspart  0-20 Units Subcutaneous TID WC  . insulin aspart  0-5 Units Subcutaneous QHS  . insulin glargine  20 Units Subcutaneous Daily  . insulin starter kit- pen needles  1 kit Other Once  . montelukast  10 mg Oral Daily  . [START ON 04/05/2019] pneumococcal 23 valent vaccine  0.5 mL Intramuscular Tomorrow-1000  . Ensure Max Protein  11 oz Oral BID     LOS: 2 days   Cherene Altes, MD [facility] Office  [phone] Pager - Text Page per Amion  If 7PM-7AM, please contact night-coverage per Amion 04/02/2019, 9:42 AM

## 2019-04-02 NOTE — Progress Notes (Signed)
Diabetes Education  Primary RN instructed patient on insulin self  Administration. Patient verbalized comprehension and demonstrated teach back of education. Also discussed education with primary MD Dr. Thereasa Solo.

## 2019-04-02 NOTE — Progress Notes (Signed)
Inpatient Diabetes Program Recommendations  AACE/ADA: New Consensus Statement on Inpatient Glycemic Control  Target Ranges:  Prepandial:   less than 140 mg/dL      Peak postprandial:   less than 180 mg/dL (1-2 hours)      Critically ill patients:  140 - 180 mg/dL   Results for DAVEDA, LAROCK (MRN [id number]) as of 04/02/2019 09:53  Ref. Range 04/01/2019 07:38 04/01/2019 11:12 04/01/2019 16:22 04/01/2019 20:58 04/01/2019 21:47 04/02/2019 07:32  Glucose-Capillary Latest Ref Range: 70 - 99 mg/dL 206 (H) 312 (H) 227 (H) 261 (H) 309 (H) 256 (H)  Results for AMAURIA, YOUNTS (MRN [id number]) as of 04/02/2019 09:53  Ref. Range 04/01/2019 00:10  Hemoglobin A1C Latest Ref Range: 4.8 - 5.6 % 10.8 (H)   Review of Glycemic Control  Diabetes history: DM2 Outpatient Diabetes medications: Glipizide 10 mg BID, Metformin 500 mg BID Current orders for Inpatient glycemic control: Lantus 20 units daily, Novolog 0-20 units TID with meals, Novolog 0-5 units QHS   Inpatient Diabetes Program Recommendations:    Insulin-Basal: Please consider increasing Lantus to 25 units daily.  Insulin-Meal Coverage: Please consider ordering Novolog 4 units TID with meals for meal coverage if patient eats at least 50% of meals.  NOTE: Spoke with patient over the phone regarding DM and insulin. Patient states that she is prescribed Glipizide and Metformin for DM but she admits that she has not been consistently taking the oral DM medications. Patient notes that the Metformin makes her feel nauseous sometimes. Discussed current A1C of 10.8% indicating an average glucose of 263 mg/dl over the past 2-3 months. Discussed importance of improving DM control to decrease risk of complications from uncontrolled DM.  Explained that she will likely be discharged on insulin. Dicussed current insulin regimen with Lantus and Novolog and discussed each insulin. Patient confirms that she was educated on an insulin pen yesterday by RN. Patient states that she  feels she can self administer insulin with an insulin pen but reports feeling nervous about self injecting insulin. Patient has not actually given herself any insulin injections yet. Informed patient that I will ask nurse to allow her to self administer insulin today. Discussed insulin injections, injection site rotation, and insulin storage. Patient states she has not received an insulin starter kit yet so will ask nursing to check with pharmacy to be sure she gets the kit today. Discussed hypoglycemia along with treatment. Encouraged patient to reach out to her PCP if glucose is consistently staying over 200 mg/dl or if she has any issues with hypoglycemia at all. Discussed savings card for Lantus and Novolog which patient can search online for and provide to her pharmacy which can decrease her copay for insulin. Patient verbalized understanding of information discussed and states that she has no questions at this time. Spoke to patient's RN and asked that patient be allowed to self administer insulin and to ask to check with pharmacy regarding the insulin pen starter kit.  If patient is discharged on insulin, she will need Rx for: insulin pens and insulin pen needles.  Thanks, Barnie Alderman, RN, MSN, CDE Diabetes Coordinator Inpatient Diabetes Program [phone] (Team Pager from 8am to 5pm)

## 2019-04-02 NOTE — Progress Notes (Signed)
Contacted [facility] pharmacy twice today concerning insulin education materials for patient. They were supposed to be delivered at 330 today but were not. [facility] pharmacist has re-ordered kit from [facility] pharmacy. 

## 2019-04-03 ENCOUNTER — Encounter (HOSPITAL_COMMUNITY): Payer: Self-pay

## 2019-04-03 LAB — GLUCOSE, CAPILLARY
Glucose-Capillary: 197 mg/dL — ABNORMAL HIGH (ref 70–99)
Glucose-Capillary: 155 mg/dL — ABNORMAL HIGH (ref 70–99)

## 2019-04-03 LAB — BASIC METABOLIC PANEL
Anion gap: 14 (ref 5–15)
BUN: 24 mg/dL — ABNORMAL HIGH (ref 6–20)
CO2: 24 mmol/L (ref 22–32)
Calcium: 9.5 mg/dL (ref 8.9–10.3)
Chloride: 92 mmol/L — ABNORMAL LOW (ref 98–111)
Creatinine, Ser: 1.35 mg/dL — ABNORMAL HIGH (ref 0.44–1.00)
GFR calc Af Amer: 54 mL/min — ABNORMAL LOW (ref >60)
GFR calc non Af Amer: 46 mL/min — ABNORMAL LOW (ref >60)
Glucose, Bld: 189 mg/dL — ABNORMAL HIGH (ref 70–99)
Potassium: 3.5 mmol/L (ref 3.5–5.1)
Sodium: 130 mmol/L — ABNORMAL LOW (ref 135–145)

## 2019-04-03 MED ORDER — LINAGLIPTIN 5 MG PO TABS: 5.0000 mg | ORAL_TABLET | Freq: Every day | ORAL | 0 refills | Status: AC

## 2019-04-03 MED ORDER — ACETAMINOPHEN 325 MG PO TABS: 650.0000 mg | ORAL_TABLET | ORAL | Status: AC | PRN

## 2019-04-03 MED ORDER — INSULIN GLARGINE 100 UNIT/ML ~~LOC~~ SOLN: 28.0000 [IU] | Freq: Every day | SUBCUTANEOUS | 11 refills | Status: AC

## 2019-04-03 NOTE — Discharge Instructions (Signed)
Based upon the date of your positive COVID test result (03/25/2019) you are advised to remain on home quarantine through 04/08/2019. See information below.     COVID-19: How to Protect Yourself and Others Know how it spreads  There is currently no vaccine to prevent coronavirus disease 2019 (COVID-19).  The best way to prevent illness is to avoid being exposed to this virus.  The virus is thought to spread mainly from person-to-person. ? Between people who are in close contact with one another (within about 6 feet). ? Through respiratory droplets produced when an infected person coughs, sneezes or talks. ? These droplets can land in the mouths or noses of people who are nearby or possibly be inhaled into the lungs. ? Some recent studies have suggested that COVID-19 may be spread by people who are not showing symptoms. Everyone should Clean your hands often  Wash your hands often with soap and water for at least 20 seconds especially after you have been in a public place, or after blowing your nose, coughing, or sneezing.  If soap and water are not readily available, use a hand sanitizer that contains at least 60% alcohol. Cover all surfaces of your hands and rub them together until they feel dry.  Avoid touching your eyes, nose, and mouth with unwashed hands. Avoid close contact  Stay home if you are sick.  Avoid close contact with people who are sick.  Put distance between yourself and other people. ? Remember that some people without symptoms may be able to spread virus. ? This is especially important for people who are at higher risk of getting very RetroStamps.it Cover your mouth and nose with a cloth face cover when around others  You could spread COVID-19 to others even if you do not feel sick.  Everyone should wear a cloth face cover when they have to go out in public, for example to the grocery  store or to pick up other necessities. ? Cloth face coverings should not be placed on young children under age 2, anyone who has trouble breathing, or is unconscious, incapacitated or otherwise unable to remove the mask without assistance.  The cloth face cover is meant to protect other people in case you are infected.  Do NOT use a facemask meant for a Research scientist (physical sciences).  Continue to keep about 6 feet between yourself and others. The cloth face cover is not a substitute for social distancing. Cover coughs and sneezes  If you are in a private setting and do not have on your cloth face covering, remember to always cover your mouth and nose with a tissue when you cough or sneeze or use the inside of your elbow.  Throw used tissues in the trash.  Immediately wash your hands with soap and water for at least 20 seconds. If soap and water are not readily available, clean your hands with a hand sanitizer that contains at least 60% alcohol. Clean and disinfect  Clean AND disinfect frequently touched surfaces daily. This includes tables, doorknobs, light switches, countertops, handles, desks, phones, keyboards, toilets, faucets, and sinks. [email or web address].[email or web address]  If surfaces are dirty, clean them: Use detergent or soap and water prior to disinfection.  Then, use a household disinfectant. You can see a list of EPA-registered household disinfectants here. [email or web address].co.uk 10/29/2018 This information is not intended to replace advice given to you by your health care provider. Make sure you discuss any questions you have with your health care provider. Document Released: 10/08/2018 Document Revised: 11/06/2018  Document Reviewed: 10/08/2018 Elsevier Patient Education  2020 Else[employer] Inc. COVID-19 COVID-19 is a respiratory infection that is caused by a virus called severe acute respiratory syndrome coronavirus 2 (SARS-CoV-2). The disease is  also known as coronavirus disease or novel coronavirus. In some people, the virus may not cause any symptoms. In others, it may cause a serious infection. The infection can get worse quickly and can lead to complications, such as:  Pneumonia, or infection of the lungs.  Acute respiratory distress syndrome or ARDS. This is fluid build-up in the lungs.  Acute respiratory failure. This is a condition in which there is not enough oxygen passing from the lungs to the body.  Sepsis or septic shock. This is a serious bodily reaction to an infection.  Blood clotting problems.  Secondary infections due to bacteria or fungus. The virus that causes COVID-19 is contagious. This means that it can spread from person to person through droplets from coughs and sneezes (respiratory secretions). What are the causes? This illness is caused by a virus. You may catch the virus by:  Breathing in droplets from an infected person's cough or sneeze.  Touching something, like a table or a doorknob, that was exposed to the virus (contaminated) and then touching your mouth, nose, or eyes. What increases the risk? Risk for infection You are more likely to be infected with this virus if you:  Live in or travel to an area with a COVID-19 outbreak.  Come in contact with a sick person who recently traveled to an area with a COVID-19 outbreak.  Provide care for or live with a person who is infected with COVID-19. Risk for serious illness You are more likely to become seriously ill from the virus if you:  Are 55 years of age or older.  Have a long-term disease that lowers your body's ability to fight infection (immunocompromised).  Live in a nursing home or long-term care facility.  Have a long-term (chronic) disease such as: ? Chronic lung disease, including chronic obstructive pulmonary disease or asthma ? Heart disease. ? Diabetes. ? Chronic kidney disease. ? Liver disease.  Are obese. What are the  signs or symptoms? Symptoms of this condition can range from mild to severe. Symptoms may appear any time from 2 to 14 days after being exposed to the virus. They include:  A fever.  A cough.  Difficulty breathing.  Chills.  Muscle pains.  A sore throat.  Loss of taste or smell. Some people may also have stomach problems, such as nausea, vomiting, or diarrhea. Other people may not have any symptoms of COVID-19. How is this diagnosed? This condition may be diagnosed based on:  Your signs and symptoms, especially if: ? You live in an area with a COVID-19 outbreak. ? You recently traveled to or from an area where the virus is common. ? You provide care for or live with a person who was diagnosed with COVID-19.  A physical exam.  Lab tests, which may include: ? A nasal swab to take a sample of fluid from your nose. ? A throat swab to take a sample of fluid from your throat. ? A sample of mucus from your lungs (sputum). ? Blood tests.  Imaging tests, which may include, X-rays, CT scan, or ultrasound. How is this treated? At present, there is no medicine to treat COVID-19. Medicines that treat other diseases are being used on a trial basis to see if they are effective against COVID-19. Your health care provider will  talk with you about ways to treat your symptoms. For most people, the infection is mild and can be managed at home with rest, fluids, and over-the-counter medicines. Treatment for a serious infection usually takes places in a hospital intensive care unit (ICU). It may include one or more of the following treatments. These treatments are given until your symptoms improve.  Receiving fluids and medicines through an IV.  Supplemental oxygen. Extra oxygen is given through a tube in the nose, a face mask, or a hood.  Positioning you to lie on your stomach (prone position). This makes it easier for oxygen to get into the lungs.  Continuous positive airway pressure (CPAP)  or bi-level positive airway pressure (BPAP) machine. This treatment uses mild air pressure to keep the airways open. A tube that is connected to a motor delivers oxygen to the body.  Ventilator. This treatment moves air into and out of the lungs by using a tube that is placed in your windpipe.  Tracheostomy. This is a procedure to create a hole in the neck so that a breathing tube can be inserted.  Extracorporeal membrane oxygenation (ECMO). This procedure gives the lungs a chance to recover by taking over the functions of the heart and lungs. It supplies oxygen to the body and removes carbon dioxide. Follow these instructions at home: Lifestyle  If you are sick, stay home except to get medical care. Your health care provider will tell you how long to stay home. Call your health care provider before you go for medical care.  Rest at home as told by your health care provider.  Do not use any products that contain nicotine or tobacco, such as cigarettes, e-cigarettes, and chewing tobacco. If you need help quitting, ask your health care provider.  Return to your normal activities as told by your health care provider. Ask your health care provider what activities are safe for you. General instructions  Take over-the-counter and prescription medicines only as told by your health care provider.  Drink enough fluid to keep your urine pale yellow.  Keep all follow-up visits as told by your health care provider. This is important. How is this prevented?  There is no vaccine to help prevent COVID-19 infection. However, there are steps you can take to protect yourself and others from this virus. To protect yourself:   Do not travel to areas where COVID-19 is a risk. The areas where COVID-19 is reported change often. To identify high-risk areas and travel restrictions, check the CDC travel website: FatFares.com.br  If you live in, or must travel to, an area where COVID-19 is a risk,  take precautions to avoid infection. ? Stay away from people who are sick. ? Wash your hands often with soap and water for 20 seconds. If soap and water are not available, use an alcohol-based hand sanitizer. ? Avoid touching your mouth, face, eyes, or nose. ? Avoid going out in public, follow guidance from your state and local health authorities. ? If you must go out in public, wear a cloth face covering or face mask. ? Disinfect objects and surfaces that are frequently touched every day. This may include:  Counters and tables.  Doorknobs and light switches.  Sinks and faucets.  Electronics, such as phones, remote controls, keyboards, computers, and tablets. To protect others: If you have symptoms of COVID-19, take steps to prevent the virus from spreading to others.  If you think you have a COVID-19 infection, contact your health care provider right  away. Tell your health care team that you think you may have a COVID-19 infection.  Stay home. Leave your house only to seek medical care. Do not use public transport.  Do not travel while you are sick.  Wash your hands often with soap and water for 20 seconds. If soap and water are not available, use alcohol-based hand sanitizer.  Stay away from other members of your household. Let healthy household members care for children and pets, if possible. If you have to care for children or pets, wash your hands often and wear a mask. If possible, stay in your own room, separate from others. Use a different bathroom.  Make sure that all people in your household wash their hands well and often.  Cough or sneeze into a tissue or your sleeve or elbow. Do not cough or sneeze into your hand or into the air.  Wear a cloth face covering or face mask. Where to find more information  Centers for Disease Control and Prevention: StickerEmporium.tn  World Health Organization: https://thompson-craig.com/ Contact a  health care provider if:  You live in or have traveled to an area where COVID-19 is a risk and you have symptoms of the infection.  You have had contact with someone who has COVID-19 and you have symptoms of the infection. Get help right away if:  You have trouble breathing.  You have pain or pressure in your chest.  You have confusion.  You have bluish lips and fingernails.  You have difficulty waking from sleep.  You have symptoms that get worse. These symptoms may represent a serious problem that is an emergency. Do not wait to see if the symptoms will go away. Get medical help right away. Call your local emergency services (911 in the [place] Do not drive yourself to the hospital. Let the emergency medical personnel know if you think you have COVID-19. Summary  COVID-19 is a respiratory infection that is caused by a virus. It is also known as coronavirus disease or novel coronavirus. It can cause serious infections, such as pneumonia, acute respiratory distress syndrome, acute respiratory failure, or sepsis.  The virus that causes COVID-19 is contagious. This means that it can spread from person to person through droplets from coughs and sneezes.  You are more likely to develop a serious illness if you are 71 years of age or older, have a weak immunity, live in a nursing home, or have chronic disease.  There is no medicine to treat COVID-19. Your health care provider will talk with you about ways to treat your symptoms.  Take steps to protect yourself and others from infection. Wash your hands often and disinfect objects and surfaces that are frequently touched every day. Stay away from people who are sick and wear a mask if you are sick. This information is not intended to replace advice given to you by your health care provider. Make sure you discuss any questions you have with your health care provider. Document Released: 07/18/2018 Document Revised: 11/07/2018 Document Reviewed:  07/18/2018 Elsevier Patient Education  2020 Elsevier Inc.   Prevent the Spread of COVID-19 if You Are Sick If you are sick with COVID-19 or think you might have COVID-19, follow the steps below to help protect other people in your home and community. Stay home except to get medical care.  Stay home. Most people with COVID-19 have mild illness and are able to recover at home without medical care. Do not leave your home, except to get  medical care. Do not visit public areas.  Take care of yourself. Get rest and stay hydrated.  Get medical care when needed. Call your doctor before you go to their office for care. But, if you have trouble breathing or other concerning symptoms, call 911 for immediate help.  Avoid public transportation, ride-sharing, or taxis. Separate yourself from other people and pets in your home.  As much as possible, stay in a specific room and away from other people and pets in your home. Also, you should use a separate bathroom, if available. If you need to be around other people or animals in or outside of the home, wear a cloth face covering. ? See COVID-19 and Animals if you have questions about pets: RebateDates.com.brhttps://www.cdc.gov/coronavirus/2019-ncov/faq.html#COVID19animals Monitor your symptoms.  Common symptoms of COVID-19 include fever and cough. Trouble breathing is a more serious symptom that means you should get medical attention.  Follow care instructions from your healthcare provider and local health department. Your local health authorities will give instructions on checking your symptoms and reporting information. If you develop emergency warning signs for COVID-19 get medical attention immediately.  Emergency warning signs include*:  Trouble breathing  Persistent pain or pressure in the chest  New confusion or not able to be woken  Bluish lips or face *This list is not all inclusive. Please consult your medical provider for any other symptoms that are  severe or concerning to you. Call 911 if you have a medical emergency. If you have a medical emergency and need to call 911, notify the operator that you have or think you might have, COVID-19. If possible, put on a facemask before medical help arrives. Call ahead before visiting your doctor.  Call ahead. Many medical visits for routine care are being postponed or done by phone or telemedicine.  If you have a medical appointment that cannot be postponed, call your doctor's office. This will help the office protect themselves and other patients. If you are sick, wear a cloth covering over your nose and mouth.  You should wear a cloth face covering over your nose and mouth if you must be around other people or animals, including pets (even at home).  You don't need to wear the cloth face covering if you are alone. If you can't put on a cloth face covering (because of trouble breathing for example), cover your coughs and sneezes in some other way. Try to stay at least 6 feet away from other people. This will help protect the people around you. Note: During the COVID-19 pandemic, medical grade facemasks are reserved for healthcare workers and some first responders. You may need to make a cloth face covering using a scarf or bandana. Cover your coughs and sneezes.  Cover your mouth and nose with a tissue when you cough or sneeze.  Throw used tissues in a lined trash can.  Immediately wash your hands with soap and water for at least 20 seconds. If soap and water are not available, clean your hands with an alcohol-based hand sanitizer that contains at least 60% alcohol. Clean your hands often.  Wash your hands often with soap and water for at least 20 seconds. This is especially important after blowing your nose, coughing, or sneezing; going to the bathroom; and before eating or preparing food.  Use hand sanitizer if soap and water are not available. Use an alcohol-based hand sanitizer with at least  60% alcohol, covering all surfaces of your hands and rubbing them together until they feel  dry.  Soap and water are the best option, especially if your hands are visibly dirty.  Avoid touching your eyes, nose, and mouth with unwashed hands. Avoid sharing personal household items.  Do not share dishes, drinking glasses, cups, eating utensils, towels, or bedding with other people in your home.  Wash these items thoroughly after using them with soap and water or put them in the dishwasher. Clean all "high-touch" surfaces everyday.  Clean and disinfect high-touch surfaces in your "sick room" and bathroom. Let someone else clean and disinfect surfaces in common areas, but not your bedroom and bathroom.  If a caregiver or other person needs to clean and disinfect a sick person's bedroom or bathroom, they should do so on an as-needed basis. The caregiver/other person should wear a mask and wait as long as possible after the sick person has used the bathroom. High-touch surfaces include phones, remote controls, counters, tabletops, doorknobs, bathroom fixtures, toilets, keyboards, tablets, and bedside tables.  Clean and disinfect areas that may have blood, stool, or body fluids on them.  Use household cleaners and disinfectants. Clean the area or item with soap and water or another detergent if it is dirty. Then use a household disinfectant. ? Be sure to follow the instructions on the label to ensure safe and effective use of the product. Many products recommend keeping the surface wet for several minutes to ensure germs are killed. Many also recommend precautions such as wearing gloves and making sure you have good ventilation during use of the product. ? Most EPA-registered household disinfectants should be effective. How to discontinue home isolation  People with COVID-19 who have stayed home (home isolated) can stop home isolation under the following conditions: ? If you will not have a test to  determine if you are still contagious, you can leave home after these three things have happened:  You have had no fever for at least 72 hours (that is three full days of no fever without the use of medicine that reduces fevers) AND  other symptoms have improved (for example, when your cough or shortness of breath has improved) AND  at least 10 days have passed since your symptoms first appeared. ? If you will be tested to determine if you are still contagious, you can leave home after these three things have happened:  You no longer have a fever (without the use of medicine that reduces fevers) AND  other symptoms have improved (for example, when your cough or shortness of breath has improved) AND  you received two negative tests in a row, 24 hours apart. Your doctor will follow CDC guidelines. In all cases, follow the guidance of your healthcare provider and local health department. The decision to stop home isolation should be made in consultation with your healthcare provider and state and local health departments. Local decisions depend on local circumstances. SouthAmericaFlowers.co.uk 10/27/2018 This information is not intended to replace advice given to you by your health care provider. Make sure you discuss any questions you have with your health care provider. Document Released: 10/08/2018 Document Revised: 11/06/2018 Document Reviewed: 10/08/2018 Elsevier Patient Education  2020 [employer].

## 2019-04-03 NOTE — Discharge Summary (Signed)
DISCHARGE SUMMARY  Amanda Shepard  MR#: [id number]  DOB:04/06/1971  Date of Admission: 03/31/2019 Date of Discharge: 04/03/2019  Attending Physician:Euline Kimbler Hennie Duos, MD  Patient's QZE:[id number], Orlene Och, NP  Consults: none   Disposition: D/C home   Follow-up Appts: Follow-up Information    Ricardo Jericho, NP Follow up in 5 day(s).   Specialty: Family Medicine Contact information: [place] [address] [phone]           Tests Needing Follow-up: -Assess CBG control -Assess adherence to diabetic diet -Recheck renal function -Assess compliance with insulin dosing -Assess tolerance of newly initiated ACE inhibitor and diuretic -Recheck LFTs as ALT was modestly elevated during this hospital stay  Discharge Diagnoses: COVID infection DM 2 with DKA -steroid-induced hyperglycemia Acute kidney injury Asthma HTN  Initial presentation: 48 year old with a history of DM 2, HTN, and mild asthma who was diagnosed with COVID 9/29 at which time she was placed on prednisone as an outpatient.  She presented to the ED 10/5 with severe hyperglycemia, polyuria, polydipsia, and severe fatigue.  She was afebrile without hypoxia but severely hyperglycemic.  Hospital Course: 9/29 SARS-CoV-2 test positive -prednisone initiated 10/5 admit to [facility] via [facility] ED for severe hyperglycemia 10/8 discharged home  COVID infection Treated with steroids as an outpatient - asymptomatic during hospital stay in regard to this diagnosis - no indication for Remdesivir or steroid therapy -stable at time of discharge  DM 2 with DKA -steroid-induced hyperglycemia Diagnosed with diabetes 2-3 years ago -has never required insulin therapy -inconsistently compliant with oral medications -A1c 10.8 - discharged home on insulin - certainly a baseline of poor controlled CBGs were only worsened significantly by the use of steroids, and likely also by COVID itself  -extensive diabetic teaching carried out during hospital stay including proper administration of insulin  Acute kidney injury Creatinine bumped up slightly the morning of her d/c to 1.35 -this was felt to be due to mild dehydration in the setting of hyperglycemia induced diuresis -follow-up in the outpatient setting is suggested  Asthma Quiescent  HTN Blood pressure well controlled during this hospital stay  Allergies as of 04/03/2019   No Known Allergies     Medication List    STOP taking these medications   glipiZIDE 10 MG 24 hr tablet Commonly known as: GLUCOTROL XL   metFORMIN 500 MG tablet Commonly known as: GLUCOPHAGE     TAKE these medications   acetaminophen 325 MG tablet Commonly known as: TYLENOL Take 2 tablets (650 mg total) by mouth every 4 (four) hours as needed for fever.   albuterol 108 (90 Base) MCG/ACT inhaler Commonly known as: VENTOLIN HFA Inhale 2 puffs into the lungs every 4 (four) hours as needed for wheezing or shortness of breath.   atorvastatin 40 MG tablet Commonly known as: LIPITOR Take 40 mg by mouth daily.   chlorpheniramine-HYDROcodone 10-8 MG/5ML Suer Commonly known as: Tussionex Pennkinetic ER Take 5 mLs by mouth 2 (two) times daily.   insulin glargine 100 UNIT/ML injection Commonly known as: LANTUS Inject 0.28 mLs (28 Units total) into the skin daily. Start taking on: April 04, 2019   linagliptin 5 MG Tabs tablet Commonly known as: TRADJENTA Take 1 tablet (5 mg total) by mouth daily. Start taking on: April 04, 2019   lisinopril-hydrochlorothiazide 20-25 MG tablet Commonly known as: ZESTORETIC Take 1 tablet by mouth daily.   montelukast 10 MG tablet Commonly known as: SINGULAIR Take 10 mg by mouth daily.  Day of Discharge BP (!) 99/59 (BP Location: Right Arm)   Pulse 100   Temp 98.7 F (37.1 C) (Oral)   Resp 18   Ht 5\' 11"  (1.803 m)   Wt 92.5 kg   SpO2 97%   BMI 28.45 kg/m   Physical Exam: General: No  acute respiratory distress Lungs: Clear to auscultation bilaterally without wheezes or crackles Cardiovascular: Regular rate and rhythm without murmur gallop or rub normal S1 and S2 Abdomen: Nontender, nondistended, soft, bowel sounds positive, no rebound, no ascites, no appreciable mass Extremities: No significant cyanosis, clubbing, or edema bilateral lower extremities  Basic Metabolic Panel: Recent Labs  Lab 03/31/19 1155 04/01/19 0010 04/01/19 0540 04/02/19 0200 04/03/19 0402  NA 122* 134* 134* 131* 130*  K 3.9 3.7 3.8 3.5 3.5  CL 85* 102 100 90* 92*  CO2 19* 22 30 25 24   GLUCOSE 675* 195* 169* 230* 189*  BUN 25* 16 13 9  24*  CREATININE 0.93 0.86 0.78 0.87 1.35*  CALCIUM 9.5 8.7* 8.7* 9.5 9.5    Liver Function Tests: Recent Labs  Lab 03/31/19 1155  AST 41  ALT 177*  ALKPHOS 169*  BILITOT 1.1  PROT 7.7  ALBUMIN 3.8    CBC: Recent Labs  Lab 03/31/19 1155 04/01/19 0010  WBC 27.7* 20.5*  NEUTROABS 24.1*  --   HGB 15.6* 13.6  HCT 43.5 38.6  MCV 82.2 84.1  PLT 569* 397    CBG: Recent Labs  Lab 04/02/19 1143 04/02/19 1644 04/02/19 2040 04/03/19 0844 04/03/19 1159  GLUCAP 321* 217* 217* 197* 155*    Recent Results (from the past 240 hour(s))  Blood Culture (routine x 2)     Status: None   Collection Time: 03/25/19  1:11 AM   Specimen: BLOOD  Result Value Ref Range Status   Specimen Description BLOOD BLOOD LEFT WRIST  Final   Special Requests   Final    BOTTLES DRAWN AEROBIC AND ANAEROBIC Blood Culture adequate volume   Culture   Final    NO GROWTH 5 DAYS Performed at [facility], [address] [place], [facility] [address]    Report Status 03/30/2019 FINAL  Final  Urine culture     Status: Abnormal   Collection Time: 03/25/19  1:11 AM   Specimen: In/Out Cath Urine  Result Value Ref Range Status   Specimen Description   Final    IN/OUT CATH URINE Performed at [facility], [address], [place], [facility] [address]     Special Requests   Final    NONE Performed at [facility], [address] [place], [facility] [address]    Culture MULTIPLE SPECIES PRESENT, SUGGEST RECOLLECTION (A)  Final   Report Status 03/26/2019 FINAL  Final  SARS Coronavirus 2 [facility] order, Performed in [facility] hospital lab) Nasopharyngeal Nasopharyngeal Swab     Status: Abnormal   Collection Time: 03/25/19  1:11 AM   Specimen: Nasopharyngeal Swab  Result Value Ref Range Status   SARS Coronavirus 2 POSITIVE (A) NEGATIVE Final    Comment: RESULT CALLED TO, READ BACK BY AND VERIFIED WITH: DANIEL LEWIS RN AT 0327 ON 03/25/2019 SNG (NOTE) If result is NEGATIVE SARS-CoV-2 target nucleic acids are NOT DETECTED. The SARS-CoV-2 RNA is generally detectable in upper and lower  respiratory specimens during the acute phase of infection. The lowest  concentration of SARS-CoV-2 viral copies this assay can detect is 250  copies / mL. A negative result does not preclude SARS-CoV-2 infection  and should not be used as the sole basis for treatment or other  patient management decisions.  A negative result may occur with  improper specimen collection / handling, submission of specimen other  than nasopharyngeal swab, presence of viral mutation(s) within the  areas targeted by this assay, and inadequate number of viral copies  (<250 copies / mL). A negative result must be combined with clinical  observations, patient history, and epidemiological information. If result is POSITIVE SARS-CoV-2 target nucleic acids are DETECTE D. The SARS-CoV-2 RNA is generally detectable in upper and lower  respiratory specimens during the acute phase of infection.  Positive  results are indicative of active infection with SARS-CoV-2.  Clinical  correlation with patient history and other diagnostic information is  necessary to determine patient infection status.  Positive results do  not rule out bacterial infection or co-infection with other  viruses. If result is PRESUMPTIVE POSTIVE SARS-CoV-2 nucleic acids MAY BE PRESENT.   A presumptive positive result was obtained on the submitted specimen  and confirmed on repeat testing.  While 2019 novel coronavirus  (SARS-CoV-2) nucleic acids may be present in the submitted sample  additional confirmatory testing may be necessary for epidemiological  and / or clinical management purposes  to differentiate between  SARS-CoV-2 and other Sarbecovirus currently known to infect humans.  If clinically indicated additional testing with an alternate test  methodology (LAB745 3) is advised. The SARS-CoV-2 RNA is generally  detectable in upper and lower respiratory specimens during the acute  phase of infection. The expected result is Negative. Fact Sheet for Patients:  BoilerBrush.com.cyhttps://www.fda.gov/media/136312/download Fact Sheet for Healthcare Providers: https://pope.com/https://www.fda.gov/media/136313/download This test is not yet approved or cleared by the [place] FDA and has been authorized for detection and/or diagnosis of SARS-CoV-2 by FDA under an Emergency Use Authorization (EUA).  This EUA will remain in effect (meaning this test can be used) for the duration of the COVID-19 declaration under Section 564(b)(1) of the Act, 21 U.S.C. section 360bbb-3(b)(1), unless the authorization is terminated or revoked sooner. Performed at [facility], [address] [place], [place] [address]   Blood Culture (routine x 2)     Status: None   Collection Time: 03/25/19  1:12 AM   Specimen: BLOOD  Result Value Ref Range Status   Specimen Description BLOOD BLOOD RIGHT WRIST  Final   Special Requests   Final    BOTTLES DRAWN AEROBIC AND ANAEROBIC Blood Culture results may not be optimal due to an excessive volume of blood received in culture bottles   Culture   Final    NO GROWTH 5 DAYS Performed at [facility], [address] [place], [place] [address]    Report Status 03/30/2019 FINAL   Final      Time spent in discharge (includes decision making & examination of pt): 35 minutes  04/03/2019, 5:53 PM   Lonia BloodJeffrey T. Maurizio Geno, MD [facility]  [phone]

## 2019-04-03 NOTE — Progress Notes (Signed)
   04/03/19   To Whom it may concern,  Amanda Shepard was admitted to [facility] on 03/31/2019 and remained under my care in the hospital through 04/03/19.  She has been advised that she should not return to work until 04/14/2019, at which time she will be cleared to resume all of her usual responsibilities.  Sincerely,  Cherene Altes, MD [facility]  [phone]

## 2019-04-03 NOTE — TOC Transition Note (Signed)
Transition of Care [facility]) - CM/SW Discharge Note   Patient Details  Name: Amanda Shepard MRN: [id number] Date of Birth: 12-26-1970  Transition of Care [facility]) CM/SW Contact:  Ninfa Meeker, RN Phone Number: [phone] (working remotely) 04/03/2019, 12:41 PM   Clinical Narrative:   48 yr old female admitted and treated for COVID 19. Thankfully patient is improving and will discharge home. Patient has PCP and has no Home Health needs. Case Manager signed off.           Patient Goals and CMS Choice        Discharge Placement                       Discharge Plan and Services: Home Self Care                                     Social Determinants of Health (SDOH) Interventions     Readmission Risk Interventions No flowsheet data found.

## 2020-06-26 IMAGING — DX DG CHEST 1V PORT
1 series · 1 of 1 positions shown · non-contrast
Comparison: None.

CLINICAL DATA: Fever, cough for 2 weeks

EXAM:
PORTABLE CHEST 1 VIEW

[chest ap]
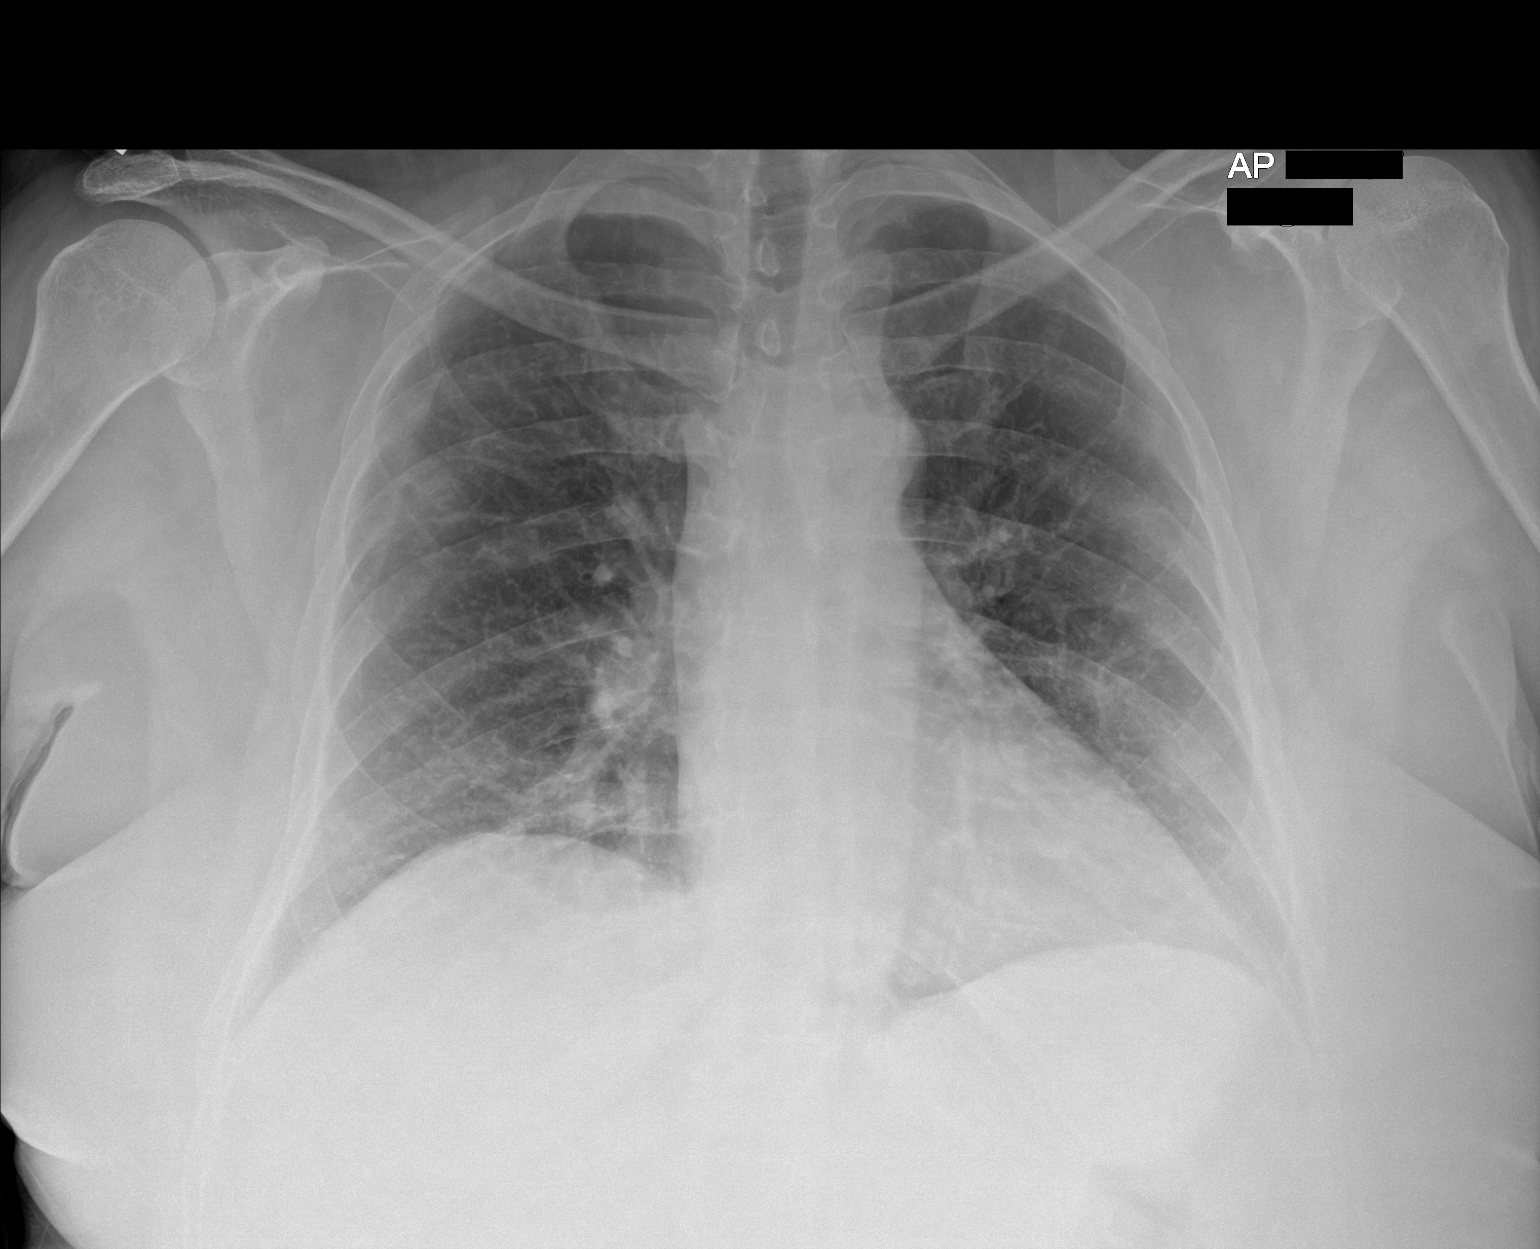

[1 of 1 positions shown; findings below may reference images not displayed]

FINDINGS: Patchy areas of consolidation are present predominantly in the lung
periphery and bases. No pneumothorax or visible effusion. The
cardiomediastinal contours are unremarkable. Mild central airways
thickening. No acute osseous or soft tissue abnormality.
IMPRESSION: 1. Airways thickening and patchy areas of consolidation in the lung
periphery and bases, consistent with multifocal pneumonia. No
pleural effusion or pneumothorax.

## 2020-07-14 ENCOUNTER — Other Ambulatory Visit: Payer: Self-pay | Admitting: Family Medicine

## 2020-07-14 DIAGNOSIS — Z1231 Encounter for screening mammogram for malignant neoplasm of breast: Secondary | ICD-10-CM

## 2023-12-14 ENCOUNTER — Other Ambulatory Visit: Payer: Self-pay | Admitting: Family Medicine

## 2023-12-14 DIAGNOSIS — Z1231 Encounter for screening mammogram for malignant neoplasm of breast: Secondary | ICD-10-CM

## 2024-01-23 ENCOUNTER — Ambulatory Visit
Admission: RE | Admit: 2024-01-23 | Discharge: 2024-01-23 | Disposition: A | Discharge status: Home / Self Care | Source: Ambulatory Visit | Attending: Family Medicine | Admitting: Family Medicine

## 2024-01-23 DIAGNOSIS — Z1231 Encounter for screening mammogram for malignant neoplasm of breast: Secondary | ICD-10-CM | POA: Diagnosis present

## 2024-01-29 ENCOUNTER — Other Ambulatory Visit: Payer: Self-pay | Admitting: Family Medicine

## 2024-01-29 DIAGNOSIS — R928 Other abnormal and inconclusive findings on diagnostic imaging of breast: Secondary | ICD-10-CM

## 2024-01-31 ENCOUNTER — Ambulatory Visit
Admission: RE | Admit: 2024-01-31 | Discharge: 2024-01-31 | Disposition: A | Discharge status: Home / Self Care | Source: Ambulatory Visit | Attending: Family Medicine | Admitting: Family Medicine

## 2024-01-31 DIAGNOSIS — R928 Other abnormal and inconclusive findings on diagnostic imaging of breast: Secondary | ICD-10-CM
